# Patient Record
Sex: Male | Born: 1950 | Race: White | Hispanic: No | Marital: Married | Smoking: Former smoker
Health system: Southern US, Community
[De-identification: ages and names within clinical notes are randomized; demographics above are authoritative.]

## PROBLEM LIST (undated history)

## (undated) DIAGNOSIS — E789 Disorder of lipoprotein metabolism, unspecified: Secondary | ICD-10-CM

## (undated) DIAGNOSIS — M0230 Reiter's disease, unspecified site: Secondary | ICD-10-CM

## (undated) DIAGNOSIS — M109 Gout, unspecified: Secondary | ICD-10-CM

## (undated) DIAGNOSIS — M1611 Unilateral primary osteoarthritis, right hip: Secondary | ICD-10-CM

## (undated) HISTORY — PX: TONSILLECTOMY: SUR1361

## (undated) HISTORY — DX: Gout, unspecified: M10.9

## (undated) HISTORY — DX: Disorder of lipoprotein metabolism, unspecified: E78.9

## (undated) HISTORY — DX: Unilateral primary osteoarthritis, right hip: M16.11

## (undated) HISTORY — DX: Reiter's disease, unspecified site: M02.30

---

## 2016-07-10 ENCOUNTER — Encounter: Payer: Self-pay | Admitting: Rheumatology

## 2016-07-11 LAB — HEPATIC FUNCTION PANEL
ALK PHOS: 104 U/L (ref 25–125)
ALT: 20 U/L (ref 10–40)
AST: 29 U/L (ref 14–40)
BILIRUBIN, TOTAL: 0.4 mg/dL

## 2016-07-11 LAB — CBC AND DIFFERENTIAL
HEMATOCRIT: 43 % (ref 41–53)
HEMOGLOBIN: 14.9 g/dL (ref 13.5–17.5)
PLATELETS: 216 10*3/uL (ref 150–399)
WBC: 4.7 10^3/mL

## 2016-07-11 LAB — BASIC METABOLIC PANEL
BUN: 16 mg/dL (ref 4–21)
Creatinine: 1.1 mg/dL (ref 0.6–1.3)
Glucose: 94 mg/dL
Potassium: 4.2 mmol/L (ref 3.4–5.3)
SODIUM: 141 mmol/L (ref 137–147)

## 2016-08-17 ENCOUNTER — Encounter: Payer: Self-pay | Admitting: Rheumatology

## 2016-08-17 DIAGNOSIS — M1611 Unilateral primary osteoarthritis, right hip: Secondary | ICD-10-CM

## 2016-08-17 DIAGNOSIS — M0230 Reiter's disease, unspecified site: Secondary | ICD-10-CM | POA: Insufficient documentation

## 2016-08-17 DIAGNOSIS — M109 Gout, unspecified: Secondary | ICD-10-CM

## 2016-08-17 HISTORY — DX: Reiter's disease, unspecified site: M02.30

## 2016-08-17 HISTORY — DX: Gout, unspecified: M10.9

## 2016-08-17 HISTORY — DX: Unilateral primary osteoarthritis, right hip: M16.11

## 2016-08-17 NOTE — Progress Notes (Addendum)
*IMAGE* Office Visit Note  Patient: Bruce Tran             Date of Birth: 09-Apr-1951           MRN: [id number]             PCP: Pcp Not In System Referring: No ref. 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 He was also on long-term prednisone at 3 mg and Dr. Estanislado Pandy are discussed with him at length on tapering off of the medication gradually.  History of Present Illness: Bruce Tran is a 65 y.o. male  On Visit Date: 03/29/2016 ===> HPI:  Bruce Tran is a 65 year old male with history of inflammatory arthritis and gout.  He states he is doing much better after the right hip joint cortisone injection.  He still has some lower back pain which he describes actually in the lower thoracic area in the paravertebral region which appears to be muscular. He has some lower extremity pain.  He denies any joint swelling.  He states he has been tolerating medications well.  He restarted taking prednisone and still on 3 mg of prednisone.  He denies any gout flare.  He has difficulty walking off and on when his back hurts.    On Visit Date: 03/29/2016 ===>IMPRESSION AND PLAN:  Seronegative inflammatory arthritis.  No synovitis on examination.  He has is on high risk prescription taking methotrexate 0.8 mL subcutaneously and prednisone 3 mg a day for which is controlling his symptoms really well.  I did discourage the use of long term prednisone.  We have tried to taper it several times in the past, but he restarts it.  As discussed again tapering prednisone he is willing to do so again.  He has gout.  He has not had any flares.  It has been very well controlled on allopurinol.  I would check his  uric acid today.  He also has history of vitamin D deficiency.  We will check vitamin D as well.  He has thoracic muscle spasm.  I offered a muscle relaxer, but he declined.  He had last bone density about 5 years ago.  I have advised him to get another bone density which he can discuss with Dr. Vista Deck and can do locally in [place].  A prescription for prednisone was given per his request and also prescription for needles and syringes were given.  Face-to-face time spent with the patient was 30 minutes, 50% time was spent in counseling and coordination of care.  He will be back in 5 months.  Patient is tapered off of the prednisone in the beginning of October and he went from 3 mg all the way down to completing his taper. Patient states that after he stopped the prednisone completely he began having his headaches. The headache is to the occipital area as well as the muscles in the neck and it radiates towards the occiput and the front. He is in so much pain that he ended up relying on his wife's diclofenac. He is taking 100 mg every day for the last few weeks.  Patient has a history of neck injury many years ago. He feels that the neck injury is contributing to all  of his neck pain. He is to keep it well controlled with the low-dose of chronic prednisone. Note the patient did taper off of the prednisone per her typical schedule. He was on 3 mg for a month then on 2 mg 4 months and then on 1 mg for a month and 1 mg every other day for about a week. And then stop.  After stopping the prednisone he started having his headaches which he finds debilitating. Note the patient states that his pain in his neck/headache is rated 11 on a scale of 0-10. He is quite aware of the side effects of the prednisone and he is trying hard for the last 1 month to stay off of the prednisone but he feels that he can. He is requesting to go back on 1 mg of prednisone even though he has plenty of 1 mg of prednisone to take 3 mg every  day, he wants to try 1 mg every day and see if that will give him some relief. I am agreeable and he will call me in a couple of weeks to notify me of how he's doing with the 1 mg.  I offered patient physical therapy for the neck but he's done that before and has given him no relief whatsoever.  We discussed using Motrin and alternating with Tylenol for the next week or 2 to see if he can try not being on prednisone at all and he is eager to do that.  He will only go on prednisone if he has no relief with Motrin alternating with Tylenol.  Patient also suffers from gout but he is well controlled with allopurinol. He has had no gout flare in a long time.     Activities of Daily Living:  Patient reports morning stiffness for 15 minutes.   Patient Reports nocturnal pain.  Difficulty dressing/grooming: Reports Difficulty climbing stairs: Denies Difficulty getting out of chair: Denies Difficulty using hands for taps, buttons, cutlery, and/or writing: Denies   Review of Systems  Constitutional: Negative for fatigue.  HENT: Negative for mouth sores and mouth dryness.   Eyes: Negative for dryness.  Respiratory: Negative for shortness of breath.   Gastrointestinal: Negative for constipation and diarrhea.  Musculoskeletal: Negative for myalgias and myalgias.  Skin: Negative for sensitivity to sunlight.  Neurological: Negative for memory loss.  Psychiatric/Behavioral: Negative for sleep disturbance.    PMFS History:  Patient Active Problem List   Diagnosis Date Noted  . Gout 08/17/2016  . 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 Osteoarthritis of right hip 08/17/2016    Past Medical History:  Diagnosis Date  . Borderline high cholesterol   . Gout 08/17/2016   Crystal Proven  . Osteoarthritis of right hip 08/17/2016  . 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 Emphysema Mother    Past Surgical History:  Procedure Laterality Date  .  TONSILLECTOMY     Social History   Social History Narrative  . No narrative on file   On Visit Date: 03/29/2016 ===> SOCIAL HISTORY:  He is a nonsmoker.  Does not drink any alcohol.  Drinks diet decaffeinated soda and caffeinated coffee.  He does exercise on a regular basis.   On Visit Date: 03/29/2016 ===> CURRENT MEDICATIONS:  Prednisone 3 mg a day, allopurinol 300 mg a day, methotrexate 0.8 mL subcutaneously and folic acid 2 mg a day.    On Visit Date: 03/29/2016 ===> MEDICATION ALLERGIES:  NO KNOWN  DRUG ALLERGIES.   Objective: Vital Signs: BP (!) 162/103 (BP Location: Left Arm, Patient Position: Sitting, Cuff Size: Large)   Pulse 78   Resp 14   Ht 5' 10.5" (1.791 m)   Wt 218 lb (98.9 kg)   BMI 30.84 kg/m    Physical Exam  Constitutional: He is oriented to person, place, and time. He appears well-developed and well-nourished.  HENT:  Head: Normocephalic and atraumatic.  Eyes: Conjunctivae and EOM are normal. Pupils are equal, round, and reactive to light.  Neck: Normal range of motion. Neck supple.  Cardiovascular: Normal rate, regular rhythm and normal heart sounds.  Exam reveals no gallop and no friction rub.   No murmur heard. Pulmonary/Chest: Effort normal and breath sounds normal. No respiratory distress. He has no wheezes. He has no rales. He exhibits no tenderness.  Abdominal: Soft. He exhibits no distension and no mass. There is no tenderness. There is no guarding.  Musculoskeletal: Normal range of motion.  Lymphadenopathy:    He has no cervical adenopathy.  Neurological: He is alert and oriented to person, place, and time. He exhibits normal muscle tone. Coordination normal.  Skin: Skin is warm and dry. Capillary refill takes less than 2 seconds. No rash noted.  Psychiatric: He has a normal mood and affect. His behavior is normal. Judgment and thought content normal.  Nursing note and vitals reviewed.    Musculoskeletal Exam:   On Visit Date: 03/29/2016 ===>  Musculoskeletal:  He has good range of motion of his C-spine, thoracic and lumbar spine, shoulders, elbows, wrist joints, MCP, PIP, DIP.  He has thickening of PIP, DIP joints in his hands and also his right elbow joint contracture which has not changed.  He has good range of motion of his hip joints, knee joints, ankles, MTP, PIP.  He had a lot of discomfort in the lower thoracic region in the muscular part paravertebral region.   Today's examination: Full range of motion of all joints Grip strength is equal and strong bilaterally Fibromyalgia tender points are all absent  CDAI Exam: CDAI Homunculus Exam:   Joint Counts:  CDAI Tender Joint count: 0 CDAI Swollen Joint count: 0     Investigation:  On Visit Date: 03/29/2016 ===> INVESTIGATIONS:  March 2017:  CBC and comprehensive metabolic panel was normal.  February 2016 uric acid was 6.3.  On Aug 21, 2016 ===> pt returns this morning for AM Cortisol levels.  He will return Jan 2018 for his standing order.  He wants to get labs drawn from our office next time also.  No additional findings.   Imaging: No results found.  Speciality Comments: No specialty comments available.    Procedures:  No procedures performed Allergies: Patient has no known allergies.   Assessment / Plan: Visit Diagnoses: Inflammatory arthritis  Chronic gout without tophus, unspecified cause, unspecified site - Plan: Uric acid, Uric acid  Neck pain, chronic  High risk medications (not anticoagulants) long-term use - Plan: CBC with Differential/Platelet, COMPLETE METABOLIC PANEL WITH GFR, Uric acid, Cortisol-am, blood, CBC with Differential/Platelet, COMPLETE METABOLIC PANEL WITH GFR, Uric acid, Cortisol-am, blood  Encounter for long-term (current) use of high-risk medication  High risk medication use   For patient's neck pain, we discussed Voltaren gel. He is agreeable. I will prescribe Voltaren gel 3 g of 3 large joints up to 3 times a day when  necessary dispensed 3 tubes with refills  We will also use Robaxin 500 mg 1 by mouth 3 times a day  when necessary dispense 90 pills with 5 refills  He will use Motrin 600 mg on one day and alternate with Tylenol 2 pills up to 3 times a day when necessary  He will also use only supplement list if he can before he uses the over-the-counter and states or Tylenol.  If none of this works, then patient has the option of starting his prednisone and he has enough at home. He will start with the lower dose to 1 mg.  Refill allopurinol  Due to the long-term use of prednisone and relatively short taper over the last 3 months, Dr. Estanislado Pandy wants to be sure that he is not having any adrenal insufficiency. As a result will be trying a.m. cortisol level. Patient will return to clinic tomorrow to get the blood drawn. He lives in [place] but he finds this the best option for him. He's had a lot of hassles at his PCPs office of getting the proper blood drawn.  His last blood work done on 07/10/2016 with CBC with differential and CMP with GFR normal.  We will do uric acid test on the next labs and then 6 months later. He is doing well with his uric acid and gout overall and he does not need any more frequent levels than that.  The neck pain causing his headaches is an issue that I am concerned about. We may want to do an MRI if this problem continues. We will wait and see how well he does with the muscle relaxer that have given him.  Orders: Orders Placed This Encounter  Procedures  . CBC with Differential/Platelet  . COMPLETE METABOLIC PANEL WITH GFR  . Uric acid  . Cortisol-am, blood   Meds ordered this encounter  Medications  . omeprazole (PRILOSEC) 20 MG capsule  . B-D TB SYRINGE 1CC/27GX1/2" 27G X 1/2" 1 ML MISC  . diclofenac sodium (VOLTAREN) 1 % GEL    Sig: Voltaren Gel 3 grams to 3 large joints upto TID 3 TUBES with 3 refills    Dispense:  3 Tube    Refill:  3    Voltaren Gel 3  grams to 3 large joints upto TID 3 TUBES with 3 refills    Order Specific Question:   Supervising Provider    Answer:   Bo Merino [[id number]]  . methocarbamol (ROBAXIN) 500 MG tablet    Sig: Take 1 tablet (500 mg total) by mouth 3 (three) times daily.    Dispense:  90 tablet    Refill:  1    Order Specific Question:   Supervising Provider    Answer:   Bo Merino [[id number]]  . Methotrexate, PF, 20 MG/0.4ML SOAJ    Sig: 0.8 mL subcutaneous every week; disp 31ml with preservatives.    Dispense:  10 mL    Refill:  0    Order Specific Question:   Supervising Provider    Answer:   Bo Merino [id number]    Face-to-face time spent with patient was 40 minutes. 50% of time was spent in counseling and coordination of care.  Follow-Up Instructions: Return in about 5 months (around 01/18/2017) for Inflam Arthritis (sero neg), Mtx, Gout, Allopurinol, neck pain, neck muscle spasm.  I examined and evaluated the patient with Eliezer Lofts PA. The plan of care was discussed as noted above.  Bo Merino, MD

## 2016-08-20 ENCOUNTER — Encounter: Payer: Self-pay | Admitting: Rheumatology

## 2016-08-20 ENCOUNTER — Ambulatory Visit (INDEPENDENT_AMBULATORY_CARE_PROVIDER_SITE_OTHER): Payer: Medicare Other | Admitting: Rheumatology

## 2016-08-20 VITALS — BP 162/103 | HR 78 | Resp 14 | Ht 70.5 in | Wt 218.0 lb

## 2016-08-20 DIAGNOSIS — M1A9XX0 Chronic gout, unspecified, without tophus (tophi): Secondary | ICD-10-CM | POA: Diagnosis not present

## 2016-08-20 DIAGNOSIS — M542 Cervicalgia: Secondary | ICD-10-CM

## 2016-08-20 DIAGNOSIS — M1990 Unspecified osteoarthritis, unspecified site: Secondary | ICD-10-CM

## 2016-08-20 DIAGNOSIS — Z79899 Other long term (current) drug therapy: Secondary | ICD-10-CM | POA: Diagnosis not present

## 2016-08-20 DIAGNOSIS — G8929 Other chronic pain: Secondary | ICD-10-CM

## 2016-08-20 MED ORDER — METHOTREXATE (PF) 20 MG/0.4ML ~~LOC~~ SOAJ: SUBCUTANEOUS | 0 refills | Status: DC

## 2016-08-20 MED ORDER — DICLOFENAC SODIUM 1 % TD GEL: TRANSDERMAL | 3 refills | Status: DC

## 2016-08-20 MED ORDER — METHOCARBAMOL 500 MG PO TABS: 500.0000 mg | ORAL_TABLET | Freq: Three times a day (TID) | ORAL | 1 refills | Status: AC

## 2016-08-20 NOTE — Patient Instructions (Signed)
Supplements for OA Natural anti-inflammatories  You can purchase these at [employer], [employer] or online.  . Turmeric (capsules)  . Ginger (ginger root or capsules)  . Omega 3 (Fish, flax seeds, chia seeds, walnuts, almonds)  . Tart cherry (dried or extract)   Patient should be under the care of a physician while taking these supplements. This may not be reproduced without the permission of Dr. Shaili Deveshwar.  

## 2016-08-21 NOTE — Addendum Note (Signed)
Addended byEliezer Lofts on: 08/21/2016 08:32 AM   Modules accepted: Orders

## 2016-08-22 LAB — CORTISOL-AM, BLOOD: Cortisol - AM: 15.2 ug/dL

## 2016-08-22 NOTE — Progress Notes (Signed)
Informed patient that his morning cortisol level is normal. No further treatment is needed at this time regarding his cortisol level.We only did this because he was taking prednisone for many years and we had to stop it over time and wanted to ensure that there was no adrenocortical suppression.

## 2016-08-23 ENCOUNTER — Other Ambulatory Visit: Payer: Self-pay | Admitting: Radiology

## 2016-08-23 MED ORDER — METHOTREXATE SODIUM CHEMO INJECTION 50 MG/2ML: 20.0000 mg | Freq: Once | INTRAMUSCULAR | 0 refills | Status: DC

## 2016-08-23 NOTE — Telephone Encounter (Signed)
Methotrexate pen / Otrexup was sent yesterday, I think you were trying to send in the Injectable MTX< I have pended Rx if this is correct, please submit

## 2016-08-23 NOTE — Telephone Encounter (Signed)
Okay to prescribe methotrexate 50 mg per 2 ML Inject 0.8 ML's every week Dispense 10 ML's

## 2016-10-30 ENCOUNTER — Other Ambulatory Visit: Payer: Self-pay | Admitting: *Deleted

## 2016-10-30 DIAGNOSIS — Z79899 Other long term (current) drug therapy: Secondary | ICD-10-CM

## 2016-10-30 LAB — CBC WITH DIFFERENTIAL/PLATELET
BASOS ABS: 58 {cells}/uL (ref 0–200)
Basophils Relative: 1 %
EOS PCT: 5 %
Eosinophils Absolute: 290 cells/uL (ref 15–500)
HCT: 44.6 % (ref 38.5–50.0)
HEMOGLOBIN: 15.1 g/dL (ref 13.2–17.1)
LYMPHS ABS: 1740 {cells}/uL (ref 850–3900)
Lymphocytes Relative: 30 %
MCH: 32.2 pg (ref 27.0–33.0)
MCHC: 33.9 g/dL (ref 32.0–36.0)
MCV: 95.1 fL (ref 80.0–100.0)
MPV: 10.5 fL (ref 7.5–12.5)
Monocytes Absolute: 580 cells/uL (ref 200–950)
Monocytes Relative: 10 %
NEUTROS ABS: 3132 {cells}/uL (ref 1500–7800)
Neutrophils Relative %: 54 %
Platelets: 235 10*3/uL (ref 140–400)
RBC: 4.69 MIL/uL (ref 4.20–5.80)
RDW: 13.4 % (ref 11.0–15.0)
WBC: 5.8 10*3/uL (ref 3.8–10.8)

## 2016-10-30 LAB — COMPLETE METABOLIC PANEL WITH GFR
ALBUMIN: 3.9 g/dL (ref 3.6–5.1)
ALK PHOS: 91 U/L (ref 40–115)
ALT: 17 U/L (ref 9–46)
AST: 27 U/L (ref 10–35)
BILIRUBIN TOTAL: 0.7 mg/dL (ref 0.2–1.2)
BUN: 15 mg/dL (ref 7–25)
CO2: 27 mmol/L (ref 20–31)
Calcium: 9.3 mg/dL (ref 8.6–10.3)
Chloride: 103 mmol/L (ref 98–110)
Creat: 1.02 mg/dL (ref 0.70–1.25)
GFR, Est African American: 88 mL/min (ref >=60)
GFR, Est Non African American: 76 mL/min (ref >=60)
GLUCOSE: 95 mg/dL (ref 65–99)
Potassium: 4.9 mmol/L (ref 3.5–5.3)
SODIUM: 138 mmol/L (ref 135–146)
TOTAL PROTEIN: 6.5 g/dL (ref 6.1–8.1)

## 2016-11-09 ENCOUNTER — Other Ambulatory Visit: Payer: Self-pay | Admitting: Rheumatology

## 2016-11-09 NOTE — Telephone Encounter (Signed)
Last Visit: 08/20/16 Next Visit: 01/18/17 Labs: 10/30/16 WNL  Okay to refill Allopurinol and Folic Acid?

## 2017-01-02 DIAGNOSIS — M542 Cervicalgia: Secondary | ICD-10-CM | POA: Insufficient documentation

## 2017-01-02 DIAGNOSIS — M1990 Unspecified osteoarthritis, unspecified site: Secondary | ICD-10-CM | POA: Insufficient documentation

## 2017-01-02 DIAGNOSIS — Z79899 Other long term (current) drug therapy: Secondary | ICD-10-CM | POA: Insufficient documentation
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
## 2017-01-08 ENCOUNTER — Ambulatory Visit (INDEPENDENT_AMBULATORY_CARE_PROVIDER_SITE_OTHER): Payer: Medicare Other | Admitting: Rheumatology

## 2017-01-08 ENCOUNTER — Encounter: Payer: Self-pay | Admitting: Rheumatology

## 2017-01-08 VITALS — BP 140/80 | HR 78 | Resp 14 | Ht 70.5 in | Wt 194.0 lb

## 2017-01-08 DIAGNOSIS — M1A09X0 Idiopathic chronic gout, multiple sites, without tophus (tophi): Secondary | ICD-10-CM | POA: Diagnosis not present

## 2017-01-08 DIAGNOSIS — M65331 Trigger finger, right middle finger: Secondary | ICD-10-CM | POA: Diagnosis not present

## 2017-01-08 DIAGNOSIS — Z79899 Other long term (current) drug therapy: Secondary | ICD-10-CM | POA: Diagnosis not present

## 2017-01-08 DIAGNOSIS — M542 Cervicalgia: Secondary | ICD-10-CM

## 2017-01-08 DIAGNOSIS — M1990 Unspecified osteoarthritis, unspecified site: Secondary | ICD-10-CM

## 2017-01-08 DIAGNOSIS — G5601 Carpal tunnel syndrome, right upper limb: Secondary | ICD-10-CM

## 2017-01-08 LAB — CBC WITH DIFFERENTIAL/PLATELET
BASOS ABS: 59 {cells}/uL (ref 0–200)
Basophils Relative: 1 %
EOS ABS: 177 {cells}/uL (ref 15–500)
Eosinophils Relative: 3 %
HEMATOCRIT: 44.4 % (ref 38.5–50.0)
Hemoglobin: 15.1 g/dL (ref 13.2–17.1)
LYMPHS PCT: 27 %
Lymphs Abs: 1593 cells/uL (ref 850–3900)
MCH: 32.2 pg (ref 27.0–33.0)
MCHC: 34 g/dL (ref 32.0–36.0)
MCV: 94.7 fL (ref 80.0–100.0)
MONO ABS: 472 {cells}/uL (ref 200–950)
MPV: 10.3 fL (ref 7.5–12.5)
Monocytes Relative: 8 %
NEUTROS PCT: 61 %
Neutro Abs: 3599 cells/uL (ref 1500–7800)
Platelets: 231 10*3/uL (ref 140–400)
RBC: 4.69 MIL/uL (ref 4.20–5.80)
RDW: 15.8 % — AB (ref 11.0–15.0)
WBC: 5.9 10*3/uL (ref 3.8–10.8)

## 2017-01-08 LAB — COMPLETE METABOLIC PANEL WITH GFR
ALT: 18 U/L (ref 9–46)
AST: 25 U/L (ref 10–35)
Albumin: 4.1 g/dL (ref 3.6–5.1)
Alkaline Phosphatase: 92 U/L (ref 40–115)
BUN: 15 mg/dL (ref 7–25)
CO2: 22 mmol/L (ref 20–31)
Calcium: 9.5 mg/dL (ref 8.6–10.3)
Chloride: 105 mmol/L (ref 98–110)
Creat: 1.02 mg/dL (ref 0.70–1.25)
GFR, EST NON AFRICAN AMERICAN: 76 mL/min (ref >=60)
GFR, Est African American: 88 mL/min (ref >=60)
GLUCOSE: 89 mg/dL (ref 65–99)
POTASSIUM: 4.7 mmol/L (ref 3.5–5.3)
Sodium: 140 mmol/L (ref 135–146)
Total Bilirubin: 0.5 mg/dL (ref 0.2–1.2)
Total Protein: 6.8 g/dL (ref 6.1–8.1)

## 2017-01-08 MED ORDER — PREDNISONE 5 MG PO TABS: ORAL_TABLET | ORAL | 0 refills | Status: DC

## 2017-01-08 MED ORDER — "BD TB SYRINGE 27G X 1/2"" 1 ML MISC": 1.0000 | 4 refills | Status: DC

## 2017-01-09 LAB — URIC ACID: Uric Acid, Serum: 4.8 mg/dL (ref 4.0–8.0)

## 2017-01-18 ENCOUNTER — Ambulatory Visit: Payer: Medicare Other | Admitting: Rheumatology

## 2017-02-19 ENCOUNTER — Other Ambulatory Visit: Payer: Self-pay | Admitting: Rheumatology

## 2017-02-19 NOTE — Telephone Encounter (Signed)
Last Visit: 01/08/17 Next Visit: 06/11/17  Okay to refill Omprazole?

## 2017-02-19 NOTE — Telephone Encounter (Signed)
ok 

## 2017-04-29 ENCOUNTER — Other Ambulatory Visit: Payer: Self-pay

## 2017-04-29 DIAGNOSIS — Z79899 Other long term (current) drug therapy: Secondary | ICD-10-CM

## 2017-04-29 LAB — CBC WITH DIFFERENTIAL/PLATELET
BASOS ABS: 59 {cells}/uL (ref 0–200)
Basophils Relative: 1 %
Eosinophils Absolute: 177 cells/uL (ref 15–500)
Eosinophils Relative: 3 %
HCT: 45.1 % (ref 38.5–50.0)
HEMOGLOBIN: 15.1 g/dL (ref 13.2–17.1)
LYMPHS PCT: 32 %
Lymphs Abs: 1888 cells/uL (ref 850–3900)
MCH: 33.3 pg — AB (ref 27.0–33.0)
MCHC: 33.5 g/dL (ref 32.0–36.0)
MCV: 99.3 fL (ref 80.0–100.0)
MPV: 10.6 fL (ref 7.5–12.5)
Monocytes Absolute: 413 cells/uL (ref 200–950)
Monocytes Relative: 7 %
NEUTROS PCT: 57 %
Neutro Abs: 3363 cells/uL (ref 1500–7800)
Platelets: 219 10*3/uL (ref 140–400)
RBC: 4.54 MIL/uL (ref 4.20–5.80)
RDW: 13.8 % (ref 11.0–15.0)
WBC: 5.9 10*3/uL (ref 3.8–10.8)

## 2017-04-30 LAB — COMPLETE METABOLIC PANEL WITHOUT GFR
ALT: 13 U/L (ref 9–46)
AST: 20 U/L (ref 10–35)
Albumin: 4 g/dL (ref 3.6–5.1)
Alkaline Phosphatase: 93 U/L (ref 40–115)
BUN: 15 mg/dL (ref 7–25)
CO2: 24 mmol/L (ref 20–31)
Calcium: 8.9 mg/dL (ref 8.6–10.3)
Chloride: 104 mmol/L (ref 98–110)
Creat: 1 mg/dL (ref 0.70–1.25)
GFR, Est African American: >89 mL/min
GFR, Est Non African American: 78 mL/min
Glucose, Bld: 88 mg/dL (ref 65–99)
Potassium: 4.8 mmol/L (ref 3.5–5.3)
Sodium: 138 mmol/L (ref 135–146)
Total Bilirubin: 0.5 mg/dL (ref 0.2–1.2)
Total Protein: 6.5 g/dL (ref 6.1–8.1)

## 2017-05-15 ENCOUNTER — Other Ambulatory Visit: Payer: Self-pay | Admitting: Rheumatology

## 2017-05-15 NOTE — Telephone Encounter (Signed)
Last Visit: 01/08/17 Next Visit: 06/11/17 Labs: 04/29/17 WNL  Okay to refill per Dr. Estanislado Pandy

## 2017-05-20 ENCOUNTER — Other Ambulatory Visit: Payer: Self-pay | Admitting: Rheumatology

## 2017-05-20 NOTE — Telephone Encounter (Signed)
Patient calling in reference to MTX refill. Seems to be a discrepancy with rx . (ML) issues [facility] needs clarification. Please call patient to discuss. [phone]

## 2017-05-20 NOTE — Telephone Encounter (Signed)
Called [facility] spoke to [place]  and clarified prescription directions.

## 2017-05-22 ENCOUNTER — Telehealth: Payer: Self-pay | Admitting: Rheumatology

## 2017-05-22 ENCOUNTER — Telehealth: Payer: Self-pay | Admitting: Radiology

## 2017-05-22 MED ORDER — METHOTREXATE SODIUM CHEMO INJECTION 50 MG/2ML: 20.0000 mg | INTRAMUSCULAR | 0 refills | Status: DC

## 2017-05-22 NOTE — Telephone Encounter (Signed)
Refill request received via fax for Methotrexate refill from [facility]

## 2017-05-22 NOTE — Telephone Encounter (Signed)
Resent with corrected sig.

## 2017-05-22 NOTE — Telephone Encounter (Signed)
[facility] calling in ference to MTX injections. They need frequency of injections. Please call using ref# [id number]

## 2017-05-22 NOTE — Telephone Encounter (Signed)
This was a clarification request sent in for clarification

## 2017-05-27 ENCOUNTER — Other Ambulatory Visit: Payer: Self-pay | Admitting: Rheumatology

## 2017-05-27 NOTE — Telephone Encounter (Signed)
01/08/17 06/11/17  CMP Latest Ref Rng & Units 04/29/2017 01/08/2017 10/30/2016  Glucose 65 - 99 mg/dL 88 89 95  BUN 7 - 25 mg/dL 15 15 15   Creatinine 0.70 - 1.25 mg/dL 1.00 1.02 1.02  Sodium 135 - 146 mmol/L 138 140 138  Potassium 3.5 - 5.3 mmol/L 4.8 4.7 4.9  Chloride 98 - 110 mmol/L 104 105 103  CO2 20 - 31 mmol/L 24 22 27   Calcium 8.6 - 10.3 mg/dL 8.9 9.5 9.3  Total Protein 6.1 - 8.1 g/dL 6.5 6.8 6.5  Total Bilirubin 0.2 - 1.2 mg/dL 0.5 0.5 0.7  Alkaline Phos 40 - 115 U/L 93 92 91  AST 10 - 35 U/L 20 25 27   ALT 9 - 46 U/L 13 18 17     Ok to refill per Dr Estanislado Pandy

## 2017-06-03 DIAGNOSIS — Z7952 Long term (current) use of systemic steroids: Secondary | ICD-10-CM | POA: Insufficient documentation

## 2017-06-03 NOTE — Progress Notes (Signed)
Office Visit Note  Patient: Bruce Tran             Date of Birth: June 07, 1951           MRN: [id number]             PCP: System, Pcp Not In Referring: No ref. provider found Visit Date: 06/04/2017 Occupation: @GUAROCC @    Subjective:  Muscle and tendon pain.   History of Present Illness: Bruce Tran is a 66 y.o. male with history of inflammatory arthritis. He's been on methotrexate for control of his inflammatory arthritis. He states" he doesn't have any joint pain or joint swelling but he gets pain and inflammation in his muscles and tendons". He states he developed a rash on his back. He took some prednisone after the last visit and he felt that the symptoms resolved after taking the prednisone course. He states he had some leftover prednisone and he had recurrence of symptoms and took more prednisone for that. He states without prednisone he feels very stiff and discomfort has discomfort in his muscles and tendons of his lower extremities. He continues to have some lower back pain. Although he denies any radiculopathy to his lower extremities. His right hip is doing better after the cortisone injection.  Activities of Daily Living:  Patient reports morning stiffness for 0 minute.   Patient Reports nocturnal pain. Lower extremities  Difficulty dressing/grooming: Denies Difficulty climbing stairs: Reports Difficulty getting out of chair: Reports Difficulty using hands for taps, buttons, cutlery, and/or writing: Denies   Review of Systems  Constitutional: Positive for fatigue. Negative for night sweats and weakness ( ).  HENT: Negative for mouth sores, mouth dryness and nose dryness.   Eyes: Negative for redness and dryness.  Respiratory: Negative for shortness of breath and difficulty breathing.   Cardiovascular: Negative for chest pain, palpitations, hypertension, irregular heartbeat and swelling in legs/feet.  Gastrointestinal: Negative for constipation and diarrhea.    Endocrine: Negative for increased urination.  Musculoskeletal: Positive for arthralgias, joint pain, joint swelling, myalgias and myalgias. Negative for muscle weakness, morning stiffness and muscle tenderness.  Skin: Positive for rash. Negative for color change, hair loss, nodules/bumps, skin tightness, ulcers and sensitivity to sunlight.  Allergic/Immunologic: Negative for susceptible to infections.  Neurological: Negative for dizziness, fainting, memory loss and night sweats.  Hematological: Negative for swollen glands.  Psychiatric/Behavioral: Negative for depressed mood and sleep disturbance. The patient is not nervous/anxious.     PMFS History:  Patient Active Problem List   Diagnosis Date Noted  . On prednisone therapy 06/03/2017  . Inflammatory arthritis 01/02/2017  . Neck pain 01/02/2017  . Encounter for long-term current use of high risk medication 01/02/2017  . High risk medication use 01/02/2017  . Gout 08/17/2016  . Reiter's disease ([place]) 08/17/2016  . Osteoarthritis of right hip 08/17/2016    Past Medical History:  Diagnosis Date  . Borderline high cholesterol   . Gout 08/17/2016   Crystal Proven  . Osteoarthritis of right hip 08/17/2016  . Reiter's disease ([place]) 08/17/2016    Family History  Problem Relation Age of Onset  . Emphysema Mother   . Emphysema Sister    Past Surgical History:  Procedure Laterality Date  . TONSILLECTOMY     Social History   Social History Narrative  . No narrative on file     Objective: Vital Signs: BP (!) 147/84 (BP Location: Left Arm, Patient Position: Sitting, Cuff Size: Normal)   Pulse 66   Ht 5'  10" (1.778 m)   Wt 200 lb (90.7 kg)   BMI 28.70 kg/m    Physical Exam  Constitutional: He is oriented to person, place, and time. He appears well-developed and well-nourished.  HENT:  Head: Normocephalic and atraumatic.  Eyes: Pupils are equal, round, and reactive to light. Conjunctivae and EOM are normal.  Neck: Normal  range of motion. Neck supple.  Cardiovascular: Normal rate, regular rhythm and normal heart sounds.   Pulmonary/Chest: Effort normal and breath sounds normal.  Abdominal: Soft. Bowel sounds are normal.  Neurological: He is alert and oriented to person, place, and time.  Skin: Skin is warm and dry. Capillary refill takes less than 2 seconds.  Psychiatric: He has a normal mood and affect. His behavior is normal.  Nursing note and vitals reviewed.    Musculoskeletal Exam: C-spine and thoracic lumbar spine good range of motion. No SI joint tenderness. Shoulder joints are good range of motion. He has limited extension of his bilateral elbows. He had no synovitis over his wrist joints or MCP joints. He has PIP/DIP thickening in his hands consistent with osteoarthritis. He has limited range of motion of his right hip joint with some discomfort. Knee joints ankles MTPs PIPs DIPs with good range of motion with no synovitis.  CDAI Exam: No CDAI exam completed.    Investigation: No additional findings. CBC Latest Ref Rng & Units 04/29/2017 01/08/2017 10/30/2016  WBC 3.8 - 10.8 K/uL 5.9 5.9 5.8  Hemoglobin 13.2 - 17.1 g/dL 15.1 15.1 15.1  Hematocrit 38.5 - 50.0 % 45.1 44.4 44.6  Platelets 140 - 400 K/uL 219 231 235   CMP     Component Value Date/Time   NA 138 04/29/2017 1031   NA 141 07/11/2016   K 4.8 04/29/2017 1031   CL 104 04/29/2017 1031   CO2 24 04/29/2017 1031   GLUCOSE 88 04/29/2017 1031   BUN 15 04/29/2017 1031   BUN 16 07/11/2016   CREATININE 1.00 04/29/2017 1031   CALCIUM 8.9 04/29/2017 1031   PROT 6.5 04/29/2017 1031   ALBUMIN 4.0 04/29/2017 1031   AST 20 04/29/2017 1031   ALT 13 04/29/2017 1031   ALKPHOS 93 04/29/2017 1031   BILITOT 0.5 04/29/2017 1031   GFRNONAA 78 04/29/2017 1031   GFRAA >89 04/29/2017 1031    Imaging: No results found.  Speciality Comments: No specialty comments available.    Procedures:  No procedures performed Allergies: Patient has no known  allergies.   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 Which is generally well on methotrexate. He has no synovitis on examination. He was on prednisone for multiple years and it took me a while to take him off the prednisone. He was given in the prednisone taper last visit by Mr. Carlyon Shadow. Patient states that he did much better after the prednisone taper as his muscles and tendons were aching and they all got better. He also reports that he had a rash at that time which resolved. He gives history of ongoing muscle and tendon pain and recurrent rash. I do not see any of that on examination today. We had detailed discussion regarding that I would like to witness the rash and inflammation at the some point. He's been advised to come in for an appointment when he has a flare. I would also obtain following labs with his next visit.  High risk medication use - Methotrexate 0.8 ML subcutaneous  every week, folic acid 2 mg by mouth daily - Plan: CBC with Differential/Platelet, COMPLETE METABOLIC PANEL WITH GFR, Serum protein electrophoresis with reflex. His standing orders will be checked every 3 months as he is on long-term methotrexate.  Idiopathic chronic gout of multiple sites without tophus - On allopurinol 300 mg by mouth daily. His uric acid wasn't desirable range he will continue allopurinol for right now.  Myalgia - Plan: Sedimentation rate, CK, TSH, VITAMIN D 25 Hydroxy (Vit-D Deficiency, Fractures). He has no muscle weakness on examination. We had detailed discussion regarding his myalgias. I discussed the option of Cymbalta which she declined. I also discussed that he may consider a neurological evaluation for lower back pain, lower extremity muscle pain.  On prednisone therapy - Prednisone taper April 2018. Patient is taking long-term prednisone. He was given a prednisone taper in April. He also took another  prednisone taper himself at home. I discouraged the use of prednisone. Long-term side effects of prednisone were discussed at length. He is somewhat dissatisfied that I am not prescribing prednisone.  Primary osteoarthritis of right hip: Doing better after cortisone injection.  Screening for osteoporosis - Last DEXA normal 12/08/2010 [facility] in [place]     Orders: Orders Placed This Encounter  Procedures  . CBC with Differential/Platelet  . COMPLETE METABOLIC PANEL WITH GFR  . Sedimentation rate  . CK  . TSH  . VITAMIN D 25 Hydroxy (Vit-D Deficiency, Fractures)  . Serum protein electrophoresis with reflex   No orders of the defined types were placed in this encounter.   Face-to-face time spent with patient was 30 minutes.Greater than 50% of time was spent in counseling and coordination of care.  Follow-Up Instructions: Return in about 4 months (around 10/04/2017) for inflammatory arthritis.   Bo Merino, MD  Note - This record has been created using Editor, commissioning.  Chart creation errors have been sought, but may not always  have been located. Such creation errors do not reflect on  the standard of medical care.

## 2017-06-04 ENCOUNTER — Ambulatory Visit (INDEPENDENT_AMBULATORY_CARE_PROVIDER_SITE_OTHER): Payer: Medicare Other | Admitting: Rheumatology

## 2017-06-04 ENCOUNTER — Encounter: Payer: Self-pay | Admitting: Rheumatology

## 2017-06-04 VITALS — BP 147/84 | HR 66 | Ht 70.0 in | Wt 200.0 lb

## 2017-06-04 DIAGNOSIS — Z79899 Other long term (current) drug therapy: Secondary | ICD-10-CM | POA: Diagnosis not present

## 2017-06-04 DIAGNOSIS — M1611 Unilateral primary osteoarthritis, right hip: Secondary | ICD-10-CM

## 2017-06-04 DIAGNOSIS — M791 Myalgia, unspecified site: Secondary | ICD-10-CM

## 2017-06-04 DIAGNOSIS — M1A09X0 Idiopathic chronic gout, multiple sites, without tophus (tophi): Secondary | ICD-10-CM | POA: Diagnosis not present

## 2017-06-04 DIAGNOSIS — Z7952 Long term (current) use of systemic steroids: Secondary | ICD-10-CM | POA: Diagnosis not present

## 2017-06-04 DIAGNOSIS — M0239 Reiter's disease, multiple sites: Secondary | ICD-10-CM

## 2017-06-04 DIAGNOSIS — Z13820 Encounter for screening for osteoporosis: Secondary | ICD-10-CM | POA: Diagnosis not present

## 2017-06-04 NOTE — Patient Instructions (Signed)
Standing Labs We placed an order today for your standing lab work.    Please come back and get your standing labs in October and every 3 months. Other labs are due in October.  We have open lab Monday through Friday from 8:30-11:30 AM and 1:30-4 PM at the office of Dr. Bo Merino.   The office is located at [address], [place], [place], [place] [address] No appointment is necessary.   Labs are drawn by [facility].  You may receive a bill from [facility] for your lab work. If you have any questions regarding directions or hours of operation,  please call [phone].

## 2017-06-11 ENCOUNTER — Ambulatory Visit: Payer: Medicare Other | Admitting: Rheumatology

## 2017-07-23 ENCOUNTER — Other Ambulatory Visit: Payer: Self-pay

## 2017-07-23 DIAGNOSIS — M1A09X0 Idiopathic chronic gout, multiple sites, without tophus (tophi): Secondary | ICD-10-CM

## 2017-07-23 DIAGNOSIS — M791 Myalgia, unspecified site: Secondary | ICD-10-CM

## 2017-07-23 DIAGNOSIS — Z79899 Other long term (current) drug therapy: Secondary | ICD-10-CM

## 2017-07-29 LAB — COMPLETE METABOLIC PANEL WITH GFR
AG RATIO: 1.8 (calc) (ref 1.0–2.5)
ALBUMIN MSPROF: 4.2 g/dL (ref 3.6–5.1)
ALT: 18 U/L (ref 9–46)
AST: 21 U/L (ref 10–35)
Alkaline phosphatase (APISO): 97 U/L (ref 40–115)
BUN: 14 mg/dL (ref 7–25)
CO2: 30 mmol/L (ref 20–32)
Calcium: 9.5 mg/dL (ref 8.6–10.3)
Chloride: 103 mmol/L (ref 98–110)
Creat: 0.96 mg/dL (ref 0.70–1.25)
GFR, EST AFRICAN AMERICAN: 94 mL/min/{1.73_m2} (ref >=60)
GFR, EST NON AFRICAN AMERICAN: 81 mL/min/{1.73_m2} (ref >=60)
GLOBULIN: 2.4 g/dL (ref 1.9–3.7)
Glucose, Bld: 77 mg/dL (ref 65–99)
Potassium: 4.8 mmol/L (ref 3.5–5.3)
Sodium: 139 mmol/L (ref 135–146)
TOTAL PROTEIN: 6.6 g/dL (ref 6.1–8.1)
Total Bilirubin: 0.5 mg/dL (ref 0.2–1.2)

## 2017-07-29 LAB — PROTEIN ELECTROPHORESIS, SERUM, WITH REFLEX
ALPHA 1: 0.3 g/dL (ref 0.2–0.3)
ALPHA 2: 0.6 g/dL (ref 0.5–0.9)
Albumin ELP: 4.1 g/dL (ref 3.8–4.8)
BETA 2: 0.4 g/dL (ref 0.2–0.5)
BETA GLOBULIN: 0.4 g/dL (ref 0.4–0.6)
GAMMA GLOBULIN: 0.9 g/dL (ref 0.8–1.7)
TOTAL PROTEIN: 6.7 g/dL (ref 6.1–8.1)

## 2017-07-29 LAB — VITAMIN D 25 HYDROXY (VIT D DEFICIENCY, FRACTURES): VIT D 25 HYDROXY: 44 ng/mL (ref 30–100)

## 2017-07-29 LAB — CBC WITH DIFFERENTIAL/PLATELET
BASOS ABS: 52 {cells}/uL (ref 0–200)
Basophils Relative: 1.1 %
Eosinophils Absolute: 197 cells/uL (ref 15–500)
Eosinophils Relative: 4.2 %
HEMATOCRIT: 41.4 % (ref 38.5–50.0)
Hemoglobin: 14.3 g/dL (ref 13.2–17.1)
LYMPHS ABS: 1476 {cells}/uL (ref 850–3900)
MCH: 32.4 pg (ref 27.0–33.0)
MCHC: 34.5 g/dL (ref 32.0–36.0)
MCV: 93.9 fL (ref 80.0–100.0)
MPV: 10.5 fL (ref 7.5–12.5)
Monocytes Relative: 7 %
NEUTROS PCT: 56.3 %
Neutro Abs: 2646 cells/uL (ref 1500–7800)
PLATELETS: 250 10*3/uL (ref 140–400)
RBC: 4.41 10*6/uL (ref 4.20–5.80)
RDW: 13.3 % (ref 11.0–15.0)
TOTAL LYMPHOCYTE: 31.4 %
WBC: 4.7 10*3/uL (ref 3.8–10.8)
WBCMIX: 329 {cells}/uL (ref 200–950)

## 2017-07-29 LAB — TSH: TSH: 4.17 m[IU]/L (ref 0.40–4.50)

## 2017-07-29 LAB — IFE INTERPRETATION

## 2017-07-29 LAB — CK: CK TOTAL: 135 U/L (ref 44–196)

## 2017-07-29 LAB — SEDIMENTATION RATE: Sed Rate: 6 mm/h (ref 0–20)

## 2017-07-29 LAB — URIC ACID: Uric Acid, Serum: 4.8 mg/dL (ref 4.0–8.0)

## 2017-07-29 NOTE — Progress Notes (Signed)
IFE shows monoclonal immunoglobulin. Please refer him to hematology. Rest of the labs are normal.

## 2017-07-30 ENCOUNTER — Telehealth: Payer: Self-pay | Admitting: *Deleted

## 2017-07-30 DIAGNOSIS — R899 Unspecified abnormal finding in specimens from other organs, systems and tissues: Secondary | ICD-10-CM

## 2017-07-30 NOTE — Telephone Encounter (Signed)
-----   Message from Bo Merino, MD sent at 07/29/2017  2:07 PM EDT ----- IFE shows monoclonal immunoglobulin. Please refer him to hematology. Rest of the labs are normal.

## 2017-08-05 ENCOUNTER — Telehealth: Payer: Self-pay

## 2017-08-05 NOTE — Telephone Encounter (Signed)
Patient called to check the status of referral to hematology? Please advise.

## 2017-08-07 NOTE — Telephone Encounter (Signed)
Please call patient. Referral sent to [facility], left message for Elvina Sidle to call patient to schedule appt. Thank you.

## 2017-08-08 NOTE — Telephone Encounter (Signed)
Left message to advise patient referral has been sent and Elvina Sidle should be calling to set up appointment.

## 2017-08-10 ENCOUNTER — Telehealth: Payer: Self-pay | Admitting: Hematology and Oncology

## 2017-08-10 NOTE — Telephone Encounter (Signed)
S/w pt gave appt for 11/8 @ 11am with Dr. Lebron Conners. Lvm for Ivin Booty with referring office to advise of appt.

## 2017-08-15 ENCOUNTER — Encounter: Payer: Self-pay | Admitting: Hematology and Oncology

## 2017-08-15 ENCOUNTER — Ambulatory Visit (HOSPITAL_BASED_OUTPATIENT_CLINIC_OR_DEPARTMENT_OTHER): Payer: Medicare Other | Admitting: Hematology and Oncology

## 2017-08-15 ENCOUNTER — Ambulatory Visit (HOSPITAL_BASED_OUTPATIENT_CLINIC_OR_DEPARTMENT_OTHER): Payer: Medicare Other

## 2017-08-15 ENCOUNTER — Telehealth: Payer: Self-pay | Admitting: Hematology and Oncology

## 2017-08-15 VITALS — BP 143/84 | HR 81 | Temp 97.9°F | Resp 20 | Ht 70.0 in | Wt 197.8 lb

## 2017-08-15 DIAGNOSIS — D472 Monoclonal gammopathy: Secondary | ICD-10-CM

## 2017-08-15 LAB — CBC WITH DIFFERENTIAL/PLATELET
BASO%: 0.9 % (ref 0.0–2.0)
BASOS ABS: 0.1 10*3/uL (ref 0.0–0.1)
EOS ABS: 0.1 10*3/uL (ref 0.0–0.5)
EOS%: 1.8 % (ref 0.0–7.0)
HEMATOCRIT: 44.5 % (ref 38.4–49.9)
HEMOGLOBIN: 14.9 g/dL (ref 13.0–17.1)
LYMPH#: 1.3 10*3/uL (ref 0.9–3.3)
LYMPH%: 21 % (ref 14.0–49.0)
MCH: 32.5 pg (ref 27.2–33.4)
MCHC: 33.6 g/dL (ref 32.0–36.0)
MCV: 96.6 fL (ref 79.3–98.0)
MONO#: 0.4 10*3/uL (ref 0.1–0.9)
MONO%: 6.2 % (ref 0.0–14.0)
NEUT#: 4.2 10*3/uL (ref 1.5–6.5)
NEUT%: 70.1 % (ref 39.0–75.0)
PLATELETS: 230 10*3/uL (ref 140–400)
RBC: 4.6 10*6/uL (ref 4.20–5.82)
RDW: 14.4 % (ref 11.0–14.6)
WBC: 6 10*3/uL (ref 4.0–10.3)

## 2017-08-15 LAB — COMPREHENSIVE METABOLIC PANEL
ALT: 18 U/L (ref 0–55)
ANION GAP: 11 meq/L (ref 3–11)
AST: 25 U/L (ref 5–34)
Albumin: 4.3 g/dL (ref 3.5–5.0)
Alkaline Phosphatase: 112 U/L (ref 40–150)
BILIRUBIN TOTAL: 0.87 mg/dL (ref 0.20–1.20)
BUN: 17.8 mg/dL (ref 7.0–26.0)
CO2: 24 meq/L (ref 22–29)
Calcium: 9.5 mg/dL (ref 8.4–10.4)
Chloride: 105 mEq/L (ref 98–109)
Creatinine: 1 mg/dL (ref 0.7–1.3)
GLUCOSE: 97 mg/dL (ref 70–140)
Potassium: 4.2 mEq/L (ref 3.5–5.1)
SODIUM: 140 meq/L (ref 136–145)
TOTAL PROTEIN: 7.5 g/dL (ref 6.4–8.3)

## 2017-08-15 NOTE — Telephone Encounter (Signed)
Gave avs and calendar for November  °

## 2017-08-16 LAB — KAPPA/LAMBDA LIGHT CHAINS
IG KAPPA FREE LIGHT CHAIN: 17 mg/L (ref 3.3–19.4)
IG LAMBDA FREE LIGHT CHAIN: 14.2 mg/L (ref 5.7–26.3)
Kappa/Lambda FluidC Ratio: 1.2 (ref 0.26–1.65)

## 2017-08-19 LAB — MULTIPLE MYELOMA PANEL, SERUM
Albumin SerPl Elph-Mcnc: 3.9 g/dL (ref 2.9–4.4)
Albumin/Glob SerPl: 1.4 (ref 0.7–1.7)
Alpha 1: 0.2 g/dL (ref 0.0–0.4)
Alpha2 Glob SerPl Elph-Mcnc: 0.6 g/dL (ref 0.4–1.0)
B-Globulin SerPl Elph-Mcnc: 1.1 g/dL (ref 0.7–1.3)
Gamma Glob SerPl Elph-Mcnc: 1 g/dL (ref 0.4–1.8)
Globulin, Total: 2.9 g/dL (ref 2.2–3.9)
IGA/IMMUNOGLOBULIN A, SERUM: 220 mg/dL (ref 61–437)
IGM (IMMUNOGLOBIN M), SRM: 58 mg/dL (ref 20–172)
TOTAL PROTEIN: 6.8 g/dL (ref 6.0–8.5)

## 2017-08-23 LAB — VISCOSITY, SERUM: VISCOSITY, SERUM: 1.8 rel.saline (ref 1.6–1.9)

## 2017-08-25 ENCOUNTER — Encounter: Payer: Self-pay | Admitting: Hematology and Oncology

## 2017-08-25 DIAGNOSIS — D472 Monoclonal gammopathy: Secondary | ICD-10-CM | POA: Insufficient documentation

## 2017-08-25 NOTE — Progress Notes (Signed)
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  The pertinent presence was very faint and may constitute primary hematological disorder indication such as amyloidosis or multiple myeloma, but also may be a component of his autoimmune condition.  Plan: --Repeat blood work including full protein panel, obtain skeletal survey.  If still positive, will proceed with additional assessment such as bone marrow biopsy and amyloidosis evaluation.  If lab work is negative, will defer additional evaluations at this time --Return to clinic in 2 weeks to review the findings  Voice recognition software was used and creation of this note. Despite my best effort at editing the text, some misspelling/errors may have occurred.  Orders Placed This Encounter  Procedures  . DG Bone Survey Met    Standing Status:   Future    Standing Expiration Date:   10/15/2018    Order Specific Question:   Reason for Exam (SYMPTOM  OR DIAGNOSIS REQUIRED)    Answer:   MGUS, please eval for MM    Order Specific Question:   Preferred imaging location?    Answer:   [facility]    Order Specific Question:   Radiology Contrast Protocol - do NOT remove file path    Answer:   [email or web address]  . CBC with Differential    Standing Status:   Future    Number of Occurrences:   1    Standing Expiration Date:   08/15/2018  . Comprehensive metabolic panel    Standing Status:   Future    Number of Occurrences:   1    Standing Expiration Date:   08/15/2018  . Multiple Myeloma Panel (SPEP&IFE w/QIG)    Standing Status:   Future    Number of Occurrences:   1    Standing Expiration Date:   08/15/2018  . Kappa/lambda light chains    Standing Status:    Future    Number of Occurrences:   1    Standing Expiration Date:   08/15/2018  . Viscosity, serum    Standing Status:   Future    Number of Occurrences:   1    Standing Expiration Date:   08/15/2018    All questions were answered.  . The patient knows to call the clinic with any problems, questions or concerns.  This note was electronically signed.    History of Presenting Illness Bruce Tran 66 y.o. presenting to the [facility] for "abnormal lab results" referred by Dr Bo Merino who manages patient for inflammatory arthritis.  On review of the lab work, patient was found to have a faint monoclonal protein presence mid October 2018.  At the present time, patient reports continued discomfort in the muscles and tendons.  He reports feeling well in the morning, but after 2-3 hours he has to stop engaging in activities of daily living due to stiffness and pain in the muscles and joints.  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  Patient denies fever, chills, night sweats.  Denies any unexpected weight loss or weight gain.  Activity level has been altered by the symptoms of his arthropathy.  In addition, patient carries diagnosis of gout and raters disease.  His current treatment includes methotrexate weekly and intermittent prednisone.  Oncological/hematological History: --Labs, 07/23/17: tProt 6.6, Alb ..., Ca 9.5, Cr 1.0,  AP ...; SPEP -- faint restricted band in the gammaglobulin region, IFE positive for faint IgG kappa monoclonal immunoglobulin; WBC 6.0, Hgb 14.9, Plt 250;    Medical History: Past Medical History:  Diagnosis Date  . Borderline high cholesterol   . Gout 08/17/2016   Crystal Proven  . Osteoarthritis of right hip 08/17/2016  . Reiter's disease ([place]) 08/17/2016    Surgical History: Past Surgical History:  Procedure Laterality Date  . TONSILLECTOMY      Family History: Family History  Problem Relation Age of Onset  .  Emphysema Mother   . Emphysema Sister   . Breast cancer Sister   . Breast cancer Sister   . Psoriasis Other   . Arthritis Other        Psoriatic arthritis  . Lymphoma Other        Secondary to immunosuppression    Social History: Social History   Socioeconomic History  . Marital status: Married    Spouse name: Not on file  . Number of children: Not on file  . Years of education: Not on file  . Highest education level: Not on file  Social Needs  . Financial resource strain: Not on file  . Food insecurity - worry: Not on file  . Food insecurity - inability: Not on file  . Transportation needs - medical: Not on file  . Transportation needs - non-medical: Not on file  Occupational History  . Occupation: Risk manager  . Occupation: Retired  Tobacco Use  . Smoking status: Former Smoker    Last attempt to quit: 1988    Years since quitting: 30.9  . Smokeless tobacco: Current User    Types: Snuff, Chew  Substance and Sexual Activity  . Alcohol use: Yes    Alcohol/week: 0.6 oz    Types: 1 Cans of beer per week    Comment: occasional  . Drug use: No  . Sexual activity: Not on file  Other Topics Concern  . Not on file  Social History Narrative  . Not on file    Allergies: No Known Allergies  Medications:  Current Outpatient Medications  Medication Sig Dispense Refill  . allopurinol (ZYLOPRIM) 300 MG tablet TAKE 1 TABLET DAILY 90 tablet 1  . B-D TB SYRINGE 1CC/27GX1/2" 27G X 1/2" 1 ML MISC Inject 1 Syringe into the skin once a week. 12 each 4  . diclofenac sodium (VOLTAREN) 1 % GEL Voltaren Gel 3 grams to 3 large joints upto TID 3 TUBES with 3 refills 3 Tube 3  . folic acid (FOLVITE) 1 MG tablet TAKE 2 TABLETS DAILY 180 tablet 1  . methotrexate 50 MG/2ML injection INJECT 0.8 ML (20 MG TOTAL) UNDER THE SKIN ONCE AS DIRECTED. VIAL EXPIRES 28 DAYS AFTER INITIAL USE. 10 mL 0  . methotrexate 50 MG/2ML injection Inject 0.8 mLs (20 mg total) into the skin once a week. 10  mL 0  . omeprazole (PRILOSEC) 20 MG capsule TAKE 1 CAPSULE EVERY MORNING 90 capsule 2  . predniSONE (DELTASONE) 5 MG tablet OK to Prescribe  Prednisone '5mg'$ : 4po qAM x 4 days;3po qAM x 4 days;2po qAM x 4 days;1po qAM x 4 days;1/2po qAM x 4 days; then stop.;disp 42 pills w/ no refills.; (Patient not taking: Reported on 08/15/2017) 42 tablet 0   No current facility-administered medications for this visit.     Review of Systems: Review of Systems  Musculoskeletal: Positive for arthralgias and myalgias.  Neurological: Positive for numbness.  All other systems  reviewed and are negative.    PHYSICAL EXAMINATION Blood pressure (!) 143/84, pulse 81, temperature 97.9 F (36.6 C), temperature source Oral, resp. rate 20, height '5\' 10"'$  (1.778 m), weight 197 lb 12.8 oz (89.7 kg), SpO2 99 %.  ECOG PERFORMANCE STATUS: 1 - Symptomatic but completely ambulatory  Physical Exam  Constitutional: He is oriented to person, place, and time and well-developed, well-nourished, and in no distress. No distress.  HENT:  Head: Normocephalic and atraumatic.  Mouth/Throat: Oropharynx is clear and moist. No oropharyngeal exudate.  Eyes: Conjunctivae and EOM are normal. Pupils are equal, round, and reactive to light. No scleral icterus.  Neck: Normal range of motion. No thyromegaly present.  Cardiovascular: Normal rate, regular rhythm and normal heart sounds.  No murmur heard. Pulmonary/Chest: Effort normal and breath sounds normal. No respiratory distress. He has no wheezes. He has no rales.  Abdominal: Soft. Bowel sounds are normal. He exhibits no distension. There is no tenderness. There is no rebound and no guarding.  Musculoskeletal: Normal range of motion. He exhibits no edema.  Lymphadenopathy:    He has no cervical adenopathy.  Neurological: He is alert and oriented to person, place, and time. He has normal reflexes. No cranial nerve deficit. Coordination normal.  Skin: Skin is warm and dry. No rash noted.  He is not diaphoretic. No erythema. No pallor.     LABORATORY DATA: I have personally reviewed the data as listed: Appointment on 08/15/2017  Component Date Value Ref Range Status  . Viscosity, Serum 08/15/2017 1.8  1.6 - 1.9 rel.saline Final   Comment: Values above 2.7 may indicate paraproteinemia is present. This test was developed and its performance characteristics determined by [facility]. It has not been cleared or approved by the Food and Drug Administration.   Loletha Grayer Kappa Free Light Chain 08/15/2017 17.0  3.3 - 19.4 mg/L Final  . Ig Lambda Free Light Chain 08/15/2017 14.2  5.7 - 26.3 mg/L Final  . Kappa/Lambda FluidC Ratio 08/15/2017 1.20  0.26 - 1.65 Final  . IgG, Qn, Serum 08/15/2017 1,013  700 - 1600 mg/dL Final  . IgA, Qn, Serum 08/15/2017 220  61 - 437 mg/dL Final  . IgM, Qn, Serum 08/15/2017 58  20 - 172 mg/dL Final  . Total Protein 08/15/2017 6.8  6.0 - 8.5 g/dL Final  . Albumin SerPl Elph-Mcnc 08/15/2017 3.9  2.9 - 4.4 g/dL Final  . Alpha 1 08/15/2017 0.2  0.0 - 0.4 g/dL Final  . Alpha2 Glob SerPl Elph-Mcnc 08/15/2017 0.6  0.4 - 1.0 g/dL Final  . B-Globulin SerPl Elph-Mcnc 08/15/2017 1.1  0.7 - 1.3 g/dL Final  . Gamma Glob SerPl Elph-Mcnc 08/15/2017 1.0  0.4 - 1.8 g/dL Final  . M Protein SerPl Elph-Mcnc 08/15/2017 Not Observed  Not Observed g/dL Final  . Globulin, Total 08/15/2017 2.9  2.2 - 3.9 g/dL Final  . Albumin/Glob SerPl 08/15/2017 1.4  0.7 - 1.7 Final  . IFE 1 08/15/2017 Comment   Final   An apparent normal immunofixation pattern.  . Please Note 08/15/2017 Comment   Final   Comment: Protein electrophoresis scan will follow via computer, mail, or courier delivery.   . Sodium 08/15/2017 140  136 - 145 mEq/L Final  . Potassium 08/15/2017 4.2  3.5 - 5.1 mEq/L Final  . Chloride 08/15/2017 105  98 - 109 mEq/L Final  . CO2 08/15/2017 24  22 - 29 mEq/L Final  . Glucose 08/15/2017 97  70 - 140 mg/dl Final   Glucose reference range  is for nonfasting patients. Fasting  glucose reference range is 70- 100.  Marland Kitchen BUN 08/15/2017 17.8  7.0 - 26.0 mg/dL Final  . Creatinine 08/15/2017 1.0  0.7 - 1.3 mg/dL Final  . Total Bilirubin 08/15/2017 0.87  0.20 - 1.20 mg/dL Final  . Alkaline Phosphatase 08/15/2017 112  40 - 150 U/L Final  . AST 08/15/2017 25  5 - 34 U/L Final  . ALT 08/15/2017 18  0 - 55 U/L Final  . Total Protein 08/15/2017 7.5  6.4 - 8.3 g/dL Final  . Albumin 08/15/2017 4.3  3.5 - 5.0 g/dL Final  . Calcium 08/15/2017 9.5  8.4 - 10.4 mg/dL Final  . Anion Gap 08/15/2017 11  3 - 11 mEq/L Final  . EGFR 08/15/2017 >60  >60 ml/min/1.73 m2 Final   eGFR is calculated using the CKD-EPI Creatinine Equation (2009)  . WBC 08/15/2017 6.0  4.0 - 10.3 10e3/uL Final  . NEUT# 08/15/2017 4.2  1.5 - 6.5 10e3/uL Final  . HGB 08/15/2017 14.9  13.0 - 17.1 g/dL Final  . HCT 08/15/2017 44.5  38.4 - 49.9 % Final  . Platelets 08/15/2017 230  140 - 400 10e3/uL Final  . MCV 08/15/2017 96.6  79.3 - 98.0 fL Final  . MCH 08/15/2017 32.5  27.2 - 33.4 pg Final  . MCHC 08/15/2017 33.6  32.0 - 36.0 g/dL Final  . RBC 08/15/2017 4.60  4.20 - 5.82 10e6/uL Final  . RDW 08/15/2017 14.4  11.0 - 14.6 % Final  . lymph# 08/15/2017 1.3  0.9 - 3.3 10e3/uL Final  . MONO# 08/15/2017 0.4  0.1 - 0.9 10e3/uL Final  . Eosinophils Absolute 08/15/2017 0.1  0.0 - 0.5 10e3/uL Final  . Basophils Absolute 08/15/2017 0.1  0.0 - 0.1 10e3/uL Final  . NEUT% 08/15/2017 70.1  39.0 - 75.0 % Final  . LYMPH% 08/15/2017 21.0  14.0 - 49.0 % Final  . MONO% 08/15/2017 6.2  0.0 - 14.0 % Final  . EOS% 08/15/2017 1.8  0.0 - 7.0 % Final  . BASO% 08/15/2017 0.9  0.0 - 2.0 % Final         Ardath Sax, MD

## 2017-08-25 NOTE — Assessment & Plan Note (Signed)
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  The pertinent presence was very faint and may constitute primary hematological disorder indication such as amyloidosis or multiple myeloma, but also may be a component of his autoimmune condition.  Plan: --Repeat blood work including full protein panel, obtain skeletal survey.  If still positive, will proceed with additional assessment such as bone marrow biopsy and amyloidosis evaluation.  If lab work is negative, will defer additional evaluations at this time --Return to clinic in 2 weeks to review the findings

## 2017-08-27 ENCOUNTER — Ambulatory Visit (HOSPITAL_COMMUNITY)
Admission: RE | Admit: 2017-08-27 | Discharge: 2017-08-27 | Disposition: A | Discharge status: Home / Self Care | Payer: Medicare Other | Source: Ambulatory Visit | Attending: Hematology and Oncology | Admitting: Hematology and Oncology

## 2017-08-27 ENCOUNTER — Ambulatory Visit (HOSPITAL_BASED_OUTPATIENT_CLINIC_OR_DEPARTMENT_OTHER): Payer: Medicare Other | Admitting: Hematology and Oncology

## 2017-08-27 ENCOUNTER — Encounter: Payer: Self-pay | Admitting: Hematology and Oncology

## 2017-08-27 ENCOUNTER — Telehealth: Payer: Self-pay | Admitting: Hematology and Oncology

## 2017-08-27 VITALS — BP 141/72 | HR 89 | Temp 98.2°F | Resp 18 | Ht 70.0 in | Wt 203.8 lb

## 2017-08-27 DIAGNOSIS — M5136 Other intervertebral disc degeneration, lumbar region: Secondary | ICD-10-CM | POA: Insufficient documentation

## 2017-08-27 DIAGNOSIS — D472 Monoclonal gammopathy: Secondary | ICD-10-CM

## 2017-08-27 DIAGNOSIS — M5030 Other cervical disc degeneration, unspecified cervical region: Secondary | ICD-10-CM | POA: Insufficient documentation

## 2017-08-27 DIAGNOSIS — M2550 Pain in unspecified joint: Secondary | ICD-10-CM | POA: Diagnosis not present

## 2017-08-27 NOTE — Telephone Encounter (Signed)
return visit to be determined per los

## 2017-09-14 NOTE — Assessment & Plan Note (Signed)
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  Repeat blood work obtained in our clinic demonstrates normal protein profile including no evidence of monoclonal gammopathy.  The previously noted abnormality might have been a transient elevation of immunoglobulin secondary to rheumatoid arthritis or a reactive hypergammaglobulinemia due to recent infection   Plan: --No further follow-up with hematology indicated at this point in time.  Patient will continue his follow-up with rheumatology as previously scheduled.

## 2017-09-14 NOTE — Progress Notes (Signed)
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  Repeat blood work obtained in our clinic demonstrates normal protein profile including no evidence of monoclonal gammopathy.  The previously noted abnormality might have been a transient elevation of immunoglobulin secondary to rheumatoid arthritis or a reactive hypergammaglobulinemia due to recent infection   Plan: --No further follow-up with hematology indicated at this point in time.  Patient will continue his follow-up with rheumatology as previously scheduled.  Voice recognition software was used and creation of this note. Despite my best effort at editing the text, some misspelling/errors may have occurred.  Orders Placed This Encounter  Procedures  . DG Humerus Left    Standing Status:   Future    Standing Expiration Date:   08/27/2018    Order Specific Question:   Reason for Exam (SYMPTOM  OR DIAGNOSIS REQUIRED)    Answer:   46m lucency on the recent skeletal survey, please eval for interval change    Order Specific Question:   Preferred imaging location?    Answer:   [facility]   Order Specific Question:   Radiology Contrast Protocol - do NOT remove file path    Answer:   [email or web address]    All questions were answered.  . The patient knows to call the clinic with any problems, questions or concerns.  This note was electronically signed.    History of Presenting Illness Bruce Tran a 66y.o. [place] Dr SBo Merinowho manages patient for inflammatory arthritis.  On review of the lab work, patient was found to have a faint monoclonal protein presence mid  October 2018.  At the present time, patient reports continued discomfort in the muscles and tendons.  He reports feeling well in the morning, but after 2-3 hours he has to stop engaging in activities of daily living due to stiffness and pain in the muscles and joints.  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  Patient returns to the clinic to review results of the lab work is repeated after the last visit to the clinic.  He denies any new complaints since last visit.  Patient denies fever, chills, night sweats.  Denies any unexpected weight loss or weight gain.  Activity level has been altered by the symptoms of his arthropathy.  In addition, patient carries diagnosis of gout and raters disease.  His current treatment includes methotrexate weekly and intermittent prednisone.  Oncological/hematological History: --Labs, 07/23/17: tProt 6.6, Alb   ..., Ca 9.5, Cr 1.0, AP    ...; SPEP -- faint restricted band in the gammaglobulin region, IFE positive for faint IgG kappa monoclonal immunoglobulin;                                                                         WBC 6.0, Hgb 14.9, Plt 250;  --Labs, 08/15/17: tProt 7.5, Alb 4.3, Ca 9.5, Cr 1.0, AP 112; SPEP --negative for evidence of monoclonal protein, SIFE --normal pattern without monoclonal spike; IgG 1013, IgA  220, IgM 58; kappa 17.0, lambda 14.2, KLR 1.2; WBC 6.0, Hgb 14.9, Plt 230;   Medical History: Past Medical History:  Diagnosis Date  . Borderline high cholesterol   . Gout 08/17/2016   Crystal Proven  . Osteoarthritis of right hip 08/17/2016  . 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 TONSILLECTOMY      Family History: Family History  Problem Relation Age of Onset  . Emphysema Mother   . Emphysema Sister   . Breast Tran Sister   . Breast Tran Sister   . Psoriasis Other   . Arthritis Other        Psoriatic arthritis  . Lymphoma  Other        Secondary to immunosuppression    Social History: Social History   Socioeconomic History  . Marital status: Married    Spouse name: Not on file  . Number of children: Not on file  . Years of education: Not on file  . Highest education level: Not on file  Social Needs  . Financial resource strain: Not on file  . Food insecurity - worry: Not on file  . Food insecurity - inability: Not on file  . Transportation needs - medical: Not on file  . Transportation needs - non-medical: Not on file  Occupational History  . Occupation: Risk manager  . Occupation: Retired  Tobacco Use  . Smoking status: Former Smoker    Last attempt to quit: 1988    Years since quitting: 30.9  . Smokeless tobacco: Current User    Types: Snuff, Chew  Substance and Sexual Activity  . Alcohol use: Yes    Alcohol/week: 0.6 oz    Types: 1 Cans of beer per week    Comment: occasional  . Drug use: No  . Sexual activity: Not on file  Other Topics Concern  . Not on file  Social History Narrative  . Not on file    Allergies: No Known Allergies  Medications:  Current Outpatient Medications  Medication Sig Dispense Refill  . allopurinol (ZYLOPRIM) 300 MG tablet TAKE 1 TABLET DAILY 90 tablet 1  . B-D TB SYRINGE 1CC/27GX1/2" 27G X 1/2" 1 ML MISC Inject 1 Syringe into the skin once a week. 12 each 4  . diclofenac sodium (VOLTAREN) 1 % GEL Voltaren Gel 3 grams to 3 large joints upto TID 3 TUBES with 3 refills 3 Tube 3  . folic acid (FOLVITE) 1 MG tablet TAKE 2 TABLETS DAILY 180 tablet 1  . methotrexate 50 MG/2ML injection INJECT 0.8 ML (20 MG TOTAL) UNDER THE SKIN ONCE AS DIRECTED. VIAL EXPIRES 28 DAYS AFTER INITIAL USE. 10 mL 0  . methotrexate 50 MG/2ML injection Inject 0.8 mLs (20 mg total) into the skin once a week. 10 mL 0  . omeprazole (PRILOSEC) 20 MG capsule TAKE 1 CAPSULE EVERY MORNING 90 capsule 2  . predniSONE (DELTASONE) 5 MG tablet OK to Prescribe  Prednisone '5mg'$ : 4po qAM x 4  days;3po qAM x 4 days;2po qAM x 4 days;1po qAM x 4 days;1/2po qAM x 4 days; then stop.;disp 42 pills w/ no refills.; (Patient not taking: Reported on 08/15/2017) 42 tablet 0   No current facility-administered medications for this visit.     Review of Systems: Review of Systems  Musculoskeletal: Positive for arthralgias and myalgias.  Neurological: Positive for numbness.  All other systems reviewed and are negative.    PHYSICAL EXAMINATION Blood pressure Marland Kitchen)  141/72, pulse 89, temperature 98.2 F (36.8 C), temperature source Oral, resp. rate 18, height '5\' 10"'$  (1.778 m), weight 203 lb 12.8 oz (92.4 kg), SpO2 99 %.  ECOG PERFORMANCE STATUS: 1 - Symptomatic but completely ambulatory  Physical Exam  Constitutional: He is oriented to person, place, and time and well-developed, well-nourished, and in no distress. No distress.  HENT:  Head: Normocephalic and atraumatic.  Mouth/Throat: Oropharynx is clear and moist. No oropharyngeal exudate.  Eyes: Conjunctivae and EOM are normal. Pupils are equal, round, and reactive to light. No scleral icterus.  Neck: Normal range of motion. No thyromegaly present.  Cardiovascular: Normal rate, regular rhythm and normal heart sounds.  No murmur heard. Pulmonary/Chest: Effort normal and breath sounds normal. No respiratory distress. He has no wheezes. He has no rales.  Abdominal: Soft. Bowel sounds are normal. He exhibits no distension. There is no tenderness. There is no rebound and no guarding.  Musculoskeletal: Normal range of motion. He exhibits no edema.  Lymphadenopathy:    He has no cervical adenopathy.  Neurological: He is alert and oriented to person, place, and time. He has normal reflexes. No cranial nerve deficit. Coordination normal.  Skin: Skin is warm and dry. No rash noted. He is not diaphoretic. No erythema. No pallor.     LABORATORY DATA: I have personally reviewed the data as listed: No visits with results within 1 Week(s) from this  visit.  Latest known visit with results is:  Appointment on 08/15/2017  Component Date Value Ref Range Status  . Viscosity, Serum 08/15/2017 1.8  1.6 - 1.9 rel.saline Final   Comment: Values above 2.7 may indicate paraproteinemia is present. This test was developed and its performance characteristics determined by [facility]. It has not been cleared or approved by the Food and Drug Administration.   Loletha Grayer Kappa Free Light Chain 08/15/2017 17.0  3.3 - 19.4 mg/L Final  . Ig Lambda Free Light Chain 08/15/2017 14.2  5.7 - 26.3 mg/L Final  . Kappa/Lambda FluidC Ratio 08/15/2017 1.20  0.26 - 1.65 Final  . IgG, Qn, Serum 08/15/2017 1,013  700 - 1600 mg/dL Final  . IgA, Qn, Serum 08/15/2017 220  61 - 437 mg/dL Final  . IgM, Qn, Serum 08/15/2017 58  20 - 172 mg/dL Final  . Total Protein 08/15/2017 6.8  6.0 - 8.5 g/dL Final  . Albumin SerPl Elph-Mcnc 08/15/2017 3.9  2.9 - 4.4 g/dL Final  . Alpha 1 08/15/2017 0.2  0.0 - 0.4 g/dL Final  . Alpha2 Glob SerPl Elph-Mcnc 08/15/2017 0.6  0.4 - 1.0 g/dL Final  . B-Globulin SerPl Elph-Mcnc 08/15/2017 1.1  0.7 - 1.3 g/dL Final  . Gamma Glob SerPl Elph-Mcnc 08/15/2017 1.0  0.4 - 1.8 g/dL Final  . M Protein SerPl Elph-Mcnc 08/15/2017 Not Observed  Not Observed g/dL Final  . Globulin, Total 08/15/2017 2.9  2.2 - 3.9 g/dL Final  . Albumin/Glob SerPl 08/15/2017 1.4  0.7 - 1.7 Final  . IFE 1 08/15/2017 Comment   Final   An apparent normal immunofixation pattern.  . Please Note 08/15/2017 Comment   Final   Comment: Protein electrophoresis scan will follow via computer, mail, or courier delivery.   . Sodium 08/15/2017 140  136 - 145 mEq/L Final  . Potassium 08/15/2017 4.2  3.5 - 5.1 mEq/L Final  . Chloride 08/15/2017 105  98 - 109 mEq/L Final  . CO2 08/15/2017 24  22 - 29 mEq/L Final  . Glucose 08/15/2017 97  70 - 140 mg/dl  Final   Glucose reference range is for nonfasting patients. Fasting glucose reference range is 70- 100.  Marland Kitchen BUN 08/15/2017 17.8  7.0 - 26.0  mg/dL Final  . Creatinine 08/15/2017 1.0  0.7 - 1.3 mg/dL Final  . Total Bilirubin 08/15/2017 0.87  0.20 - 1.20 mg/dL Final  . Alkaline Phosphatase 08/15/2017 112  40 - 150 U/L Final  . AST 08/15/2017 25  5 - 34 U/L Final  . ALT 08/15/2017 18  0 - 55 U/L Final  . Total Protein 08/15/2017 7.5  6.4 - 8.3 g/dL Final  . Albumin 08/15/2017 4.3  3.5 - 5.0 g/dL Final  . Calcium 08/15/2017 9.5  8.4 - 10.4 mg/dL Final  . Anion Gap 08/15/2017 11  3 - 11 mEq/L Final  . EGFR 08/15/2017 >60  >60 ml/min/1.73 m2 Final   eGFR is calculated using the CKD-EPI Creatinine Equation (2009)  . WBC 08/15/2017 6.0  4.0 - 10.3 10e3/uL Final  . NEUT# 08/15/2017 4.2  1.5 - 6.5 10e3/uL Final  . HGB 08/15/2017 14.9  13.0 - 17.1 g/dL Final  . HCT 08/15/2017 44.5  38.4 - 49.9 % Final  . Platelets 08/15/2017 230  140 - 400 10e3/uL Final  . MCV 08/15/2017 96.6  79.3 - 98.0 fL Final  . MCH 08/15/2017 32.5  27.2 - 33.4 pg Final  . MCHC 08/15/2017 33.6  32.0 - 36.0 g/dL Final  . RBC 08/15/2017 4.60  4.20 - 5.82 10e6/uL Final  . RDW 08/15/2017 14.4  11.0 - 14.6 % Final  . lymph# 08/15/2017 1.3  0.9 - 3.3 10e3/uL Final  . MONO# 08/15/2017 0.4  0.1 - 0.9 10e3/uL Final  . Eosinophils Absolute 08/15/2017 0.1  0.0 - 0.5 10e3/uL Final  . Basophils Absolute 08/15/2017 0.1  0.0 - 0.1 10e3/uL Final  . NEUT% 08/15/2017 70.1  39.0 - 75.0 % Final  . LYMPH% 08/15/2017 21.0  14.0 - 49.0 % Final  . MONO% 08/15/2017 6.2  0.0 - 14.0 % Final  . EOS% 08/15/2017 1.8  0.0 - 7.0 % Final  . BASO% 08/15/2017 0.9  0.0 - 2.0 % Final       Ardath Sax, MD

## 2017-10-05 NOTE — Progress Notes (Signed)
Office Visit Note  Patient: Bruce Tran             Date of Birth: Jan 28, 1951           MRN: [id number]             PCP: System, Pcp Not In Referring: No ref. provider found Visit Date: 10/17/2017 Occupation: @GUAROCC @    Subjective:  Joint pain.   History of Present Illness: Bruce Tran is a 66 y.o. male  with history of Reiter's disease and osteoarthritis. He states she's been having pain and discomfort in his right hip joint. He has underlying osteoarthritis. He was recently evaluated by his orthopedic surgeon in [place] were recommended total hip replacement. It will be scheduled sometimes around June. Due to his right hip pain he's been having increased lower back pain. He states she's been taking ibuprofen 600 mg twice a day which is not controlling his symptoms. He was on a trip recently were he had some leftover prednisone and he took it taper of prednisone starting at 8 mg and tapering at 1 mg over the next 12 days. Which helped his symptoms. He denies any joint swelling currently. He has not had a gout flare. His been tolerating all his medications without any problems.  Activities of Daily Living:  Patient reports morning stiffness for 15  minutes.   Patient Reports nocturnal pain.  Difficulty dressing/grooming: Reports Difficulty climbing stairs: Reports Difficulty getting out of chair: Reports Difficulty using hands for taps, buttons, cutlery, and/or writing: Reports   Review of Systems  Constitutional: Negative for fatigue, night sweats and weakness ( ).  HENT: Negative for mouth sores, mouth dryness and nose dryness.   Eyes: Negative for redness and dryness.  Respiratory: Negative for cough, shortness of breath and difficulty breathing.   Cardiovascular: Negative for chest pain, palpitations, hypertension, irregular heartbeat and swelling in legs/feet.  Gastrointestinal: Negative for blood in stool, constipation and diarrhea.  Endocrine: Negative for  increased urination.  Genitourinary: Negative for hematuria and urgency.  Musculoskeletal: Positive for arthralgias, joint pain and joint swelling. Negative for myalgias, muscle weakness, morning stiffness, muscle tenderness and myalgias.  Skin: Negative for color change, rash, hair loss, nodules/bumps, skin tightness, ulcers and sensitivity to sunlight.  Allergic/Immunologic: Negative for susceptible to infections.  Neurological: Negative for dizziness, fainting, light-headedness, numbness, memory loss and night sweats.  Hematological: Negative for swollen glands.  Psychiatric/Behavioral: Negative for depressed mood and sleep disturbance. The patient is not nervous/anxious.     PMFS History:  Patient Active Problem List   Diagnosis Date Noted  . On prednisone therapy 06/03/2017  . Inflammatory arthritis 01/02/2017  . Neck pain 01/02/2017  . Encounter for long-term current use of high risk medication 01/02/2017  . High risk medication use 01/02/2017  . Gout 08/17/2016  . 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 Osteoarthritis of right hip 08/17/2016    Past Medical History:  Diagnosis Date  . Borderline high cholesterol   . Gout 08/17/2016   Crystal Proven  . Osteoarthritis of right hip 08/17/2016  . 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 Emphysema Mother   . Emphysema Sister   . Breast cancer Sister   . Breast cancer Sister   . Psoriasis Other   . Arthritis Other        Psoriatic arthritis  . Lymphoma Other        Secondary to immunosuppression   Past Surgical History:  Procedure Laterality Date  . TONSILLECTOMY     Social History   Social History Narrative  . Not on file     Objective: Vital Signs: BP 136/75   Pulse 82   Resp 14   Ht 5\' 10"  (1.778 m)   Wt 207 lb (93.9 kg)   BMI 29.70 kg/m    Physical Exam  Constitutional: He is oriented to person, place, and time. He appears well-developed and well-nourished.    HENT:  Head: Normocephalic and atraumatic.  Eyes: Conjunctivae and EOM are normal. Pupils are equal, round, and reactive to light.  Neck: Normal range of motion. Neck supple.  Cardiovascular: Normal rate, regular rhythm and normal heart sounds.  Pulmonary/Chest: Effort normal and breath sounds normal.  Abdominal: Soft. Bowel sounds are normal.  Neurological: He is alert and oriented to person, place, and time.  Skin: Skin is warm and dry. Capillary refill takes less than 2 seconds.  Psychiatric: He has a normal mood and affect. His behavior is normal.  Nursing note and vitals reviewed.    Musculoskeletal Exam: C-spine and thoracic lumbar spine good range of motion. He has some discomfort range of motion of his lumbar spine. Shoulder joints with good range of motion. His bilateral elbow joint contractures. He has DIP PIP thickening in his hands but no synovitis was noted. Right hip joint is very limited painful range of motion. Left hip joint was good range of motion. His knee joints are good range of motion. He is some tenderness across MTPs but no synovitis was noted.  CDAI Exam: CDAI Homunculus Exam:   Tenderness:  RLE: acetabulofemoral  Joint Counts:  CDAI Tender Joint count: 0 CDAI Swollen Joint count: 0  Global Assessments:  Patient Global Assessment: 5 Provider Global Assessment: 3  CDAI Calculated Score: 8    Investigation: No additional findings. CBC Latest Ref Rng & Units 08/15/2017 07/23/2017 04/29/2017  WBC 4.0 - 10.3 10e3/uL 6.0 4.7 5.9  Hemoglobin 13.0 - 17.1 g/dL 14.9 14.3 15.1  Hematocrit 38.4 - 49.9 % 44.5 41.4 45.1  Platelets 140 - 400 10e3/uL 230 250 219   CMP Latest Ref Rng & Units 08/15/2017 08/15/2017 07/23/2017  Glucose 70 - 140 mg/dl 97 - -  BUN 7.0 - 26.0 mg/dL 17.8 - -  Creatinine 0.7 - 1.3 mg/dL 1.0 - -  Sodium 136 - 145 mEq/L 140 - -  Potassium 3.5 - 5.1 mEq/L 4.2 - -  Chloride 98 - 110 mmol/L - - -  CO2 22 - 29 mEq/L 24 - -  Calcium 8.4 -  10.4 mg/dL 9.5 - -  Total Protein 6.0 - 8.5 g/dL 7.5 6.8 6.7  Total Bilirubin 0.20 - 1.20 mg/dL 0.87 - -  Alkaline Phos 40 - 150 U/L 112 - -  AST 5 - 34 U/L 25 - -  ALT 0 - 55 U/L 18 - -    Imaging: Xr Foot 2 Views Left  Result Date: 10/17/2017 First MTP all PIP/DIP narrowing was noted. Some calcification was noted around the fifth MTP joint. No erosive changes were noted. No intertarsal joint space narrowing was noted. Inferior and posterior calcaneal spurs were noted. Impression: These funds are consistent with osteoarthritis and inflammatory arthritis.  Xr Foot 2 Views Right  Result Date: 10/17/2017 First MTP narrowing PIP/DIP narrowing was noted. Some calcification was noted around the first MTP and first PIP joint. No erosive changes were noted. No intertarsal joint space narrowing was noted. Inferior and posterior calcaneal spurs were noted.  Impression: These findings are consistent with osteoarthritis and gout.  Xr Hand 2 View Left  Result Date: 10/17/2017 Juxta articular osteopenia was noted. First and third MCP  joint narrowing was noted. All PIP and DIP joint space narrowing was noted. Intercarpal joint space narrowing was noted. No erosive changes were noted. Impression: These findings are consistent with inflammatory arthritis and osteoarthritis overlap.  Xr Hand 2 View Right  Result Date: 10/17/2017 Juxta articular osteopenia was noted. First and third MCP  joint narrowing was noted. All PIP and DIP joint space narrowing was noted. Intercarpal joint space narrowing was noted. No erosive changes were noted. Impression: These findings are consistent with inflammatory arthritis and osteoarthritis overlap.   Speciality Comments: No specialty comments available.    Procedures:  No procedures performed Allergies: Patient has no known allergies.   Assessment / Plan:     Visit Diagnoses: Reiter's disease of multiple sites [facility]) - history of inflammatory arthritis. He has no  synovitis on examination. He appears to be doing quite well on methotrexate.  High risk medication use - Methotrexate 0.8 ML subcutaneous every week, folic acid 2 mg by mouth daily. His labs have been stable we will continue to monitor his labs every 3 months.  Pain in both hands -he has been having pain and stiffness in his hands was no synovitis was noted. I believe his symptoms are coming from underlying osteoarthritis. Plan: XR Hand 2 View Right, XR Hand 2 View Left  Primary osteoarthritis of both hands  Primary osteoarthritis of right hip: He has severe osteoarthritis and is a schedule for total knee replacement later this summer.  Pain in both feet . No synovitis was noted.- Plan: XR Foot 2 Views Right, XR Foot 2 Views Left  On prednisone therapy intermittently. He's been taking prednisone mostly for his back and right hip. Hopefully with the total hip replacement he would not need prednisone therapy. Prednisone taper April 2018. Patient is taking long-term prednisone. He was given a prednisone taper in April. He also took another prednisone taper himself in December for his lower back pain.  Idiopathic chronic gout of multiple sites without tophus - On allopurinol 300 mg by mouth daily. His uric acid was 4.8 in October 2018.  Screening for osteoporosis - Last DEXA normal 12/08/2010 [facility] in [place]      Orders: Orders Placed This Encounter  Procedures  . XR Hand 2 View Right  . XR Hand 2 View Left  . XR Foot 2 Views Right  . XR Foot 2 Views Left   No orders of the defined types were placed in this encounter.   Face-to-face time spent with patient was 30 minutes. Greater than 50% of time was spent in counseling and coordination of care.  Follow-Up Instructions: Return in about 5 months (around 03/17/2018) for Rheumatoid arthritis.   Bo Merino, MD  Note - This record has been created using Editor, commissioning.  Chart creation errors have been sought,  but may not always  have been located. Such creation errors do not reflect on  the standard of medical care.

## 2017-10-17 ENCOUNTER — Encounter: Payer: Self-pay | Admitting: Rheumatology

## 2017-10-17 ENCOUNTER — Ambulatory Visit (INDEPENDENT_AMBULATORY_CARE_PROVIDER_SITE_OTHER): Payer: Medicare Other

## 2017-10-17 ENCOUNTER — Ambulatory Visit (INDEPENDENT_AMBULATORY_CARE_PROVIDER_SITE_OTHER): Payer: Self-pay

## 2017-10-17 ENCOUNTER — Ambulatory Visit (INDEPENDENT_AMBULATORY_CARE_PROVIDER_SITE_OTHER): Payer: Medicare Other | Admitting: Rheumatology

## 2017-10-17 VITALS — BP 136/75 | HR 82 | Resp 14 | Ht 70.0 in | Wt 207.0 lb

## 2017-10-17 DIAGNOSIS — M0239 Reiter's disease, multiple sites: Secondary | ICD-10-CM | POA: Diagnosis not present

## 2017-10-17 DIAGNOSIS — M19042 Primary osteoarthritis, left hand: Secondary | ICD-10-CM | POA: Diagnosis not present

## 2017-10-17 DIAGNOSIS — Z7952 Long term (current) use of systemic steroids: Secondary | ICD-10-CM

## 2017-10-17 DIAGNOSIS — M79672 Pain in left foot: Secondary | ICD-10-CM

## 2017-10-17 DIAGNOSIS — Z79899 Other long term (current) drug therapy: Secondary | ICD-10-CM

## 2017-10-17 DIAGNOSIS — Z13820 Encounter for screening for osteoporosis: Secondary | ICD-10-CM

## 2017-10-17 DIAGNOSIS — M79642 Pain in left hand: Secondary | ICD-10-CM

## 2017-10-17 DIAGNOSIS — M79671 Pain in right foot: Secondary | ICD-10-CM

## 2017-10-17 DIAGNOSIS — M1611 Unilateral primary osteoarthritis, right hip: Secondary | ICD-10-CM | POA: Diagnosis not present

## 2017-10-17 DIAGNOSIS — M19041 Primary osteoarthritis, right hand: Secondary | ICD-10-CM | POA: Diagnosis not present

## 2017-10-17 DIAGNOSIS — M791 Myalgia, unspecified site: Secondary | ICD-10-CM | POA: Diagnosis not present

## 2017-10-17 DIAGNOSIS — M79641 Pain in right hand: Secondary | ICD-10-CM | POA: Diagnosis not present

## 2017-10-17 DIAGNOSIS — M1A09X0 Idiopathic chronic gout, multiple sites, without tophus (tophi): Secondary | ICD-10-CM | POA: Diagnosis not present

## 2017-10-17 NOTE — Patient Instructions (Signed)
Standing Labs We placed an order today for your standing lab work.    Please come back and get your standing labs in February and every 3 months  We have open lab Monday through Friday from 8:30-11:30 AM and 1:30-4 PM at the office of Dr. Collin Rengel.   The office is located at [address], [address], [place], [place] [address] No appointment is necessary.   Labs are drawn by [facility].  You may receive a bill from [facility] for your lab work. If you have any questions regarding directions or hours of operation,  please call [phone].    

## 2017-12-02 ENCOUNTER — Other Ambulatory Visit: Payer: Self-pay

## 2017-12-02 ENCOUNTER — Other Ambulatory Visit: Payer: Self-pay | Admitting: *Deleted

## 2017-12-02 DIAGNOSIS — Z79899 Other long term (current) drug therapy: Secondary | ICD-10-CM

## 2017-12-02 LAB — CBC WITH DIFFERENTIAL/PLATELET
BASOS PCT: 1.2 %
Basophils Absolute: 68 cells/uL (ref 0–200)
EOS PCT: 1.9 %
Eosinophils Absolute: 108 cells/uL (ref 15–500)
HEMATOCRIT: 45 % (ref 38.5–50.0)
HEMOGLOBIN: 15.8 g/dL (ref 13.2–17.1)
Lymphs Abs: 1516 cells/uL (ref 850–3900)
MCH: 33.2 pg — ABNORMAL HIGH (ref 27.0–33.0)
MCHC: 35.1 g/dL (ref 32.0–36.0)
MCV: 94.5 fL (ref 80.0–100.0)
MPV: 10.6 fL (ref 7.5–12.5)
Monocytes Relative: 6.5 %
NEUTROS ABS: 3637 {cells}/uL (ref 1500–7800)
Neutrophils Relative %: 63.8 %
Platelets: 238 10*3/uL (ref 140–400)
RBC: 4.76 10*6/uL (ref 4.20–5.80)
RDW: 12.8 % (ref 11.0–15.0)
Total Lymphocyte: 26.6 %
WBC: 5.7 10*3/uL (ref 3.8–10.8)
WBCMIX: 371 {cells}/uL (ref 200–950)

## 2017-12-02 LAB — COMPLETE METABOLIC PANEL WITH GFR
AG Ratio: 1.7 (calc) (ref 1.0–2.5)
ALBUMIN MSPROF: 4.3 g/dL (ref 3.6–5.1)
ALKALINE PHOSPHATASE (APISO): 92 U/L (ref 40–115)
ALT: 16 U/L (ref 9–46)
AST: 22 U/L (ref 10–35)
BUN: 15 mg/dL (ref 7–25)
CO2: 29 mmol/L (ref 20–32)
CREATININE: 1.16 mg/dL (ref 0.70–1.25)
Calcium: 9.5 mg/dL (ref 8.6–10.3)
Chloride: 104 mmol/L (ref 98–110)
GFR, Est African American: 75 mL/min/{1.73_m2} (ref >=60)
GFR, Est Non African American: 64 mL/min/{1.73_m2} (ref >=60)
GLUCOSE: 82 mg/dL (ref 65–99)
Globulin: 2.5 g/dL (calc) (ref 1.9–3.7)
Potassium: 4.8 mmol/L (ref 3.5–5.3)
Sodium: 138 mmol/L (ref 135–146)
Total Bilirubin: 0.6 mg/dL (ref 0.2–1.2)
Total Protein: 6.8 g/dL (ref 6.1–8.1)

## 2017-12-12 ENCOUNTER — Other Ambulatory Visit: Payer: Self-pay | Admitting: Rheumatology

## 2017-12-12 NOTE — Telephone Encounter (Signed)
Last Visit:10/17/17 Next Visit: 03/26/18 Labs: 12/02/17 WNL  Okay to refill per Dr. Estanislado Pandy

## 2018-03-12 NOTE — Progress Notes (Signed)
Office Visit Note  Patient: Bruce Tran             Date of Birth: 03-07-1951           MRN: [id number]             PCP: Quentin Cornwall, MD Referring: No ref. provider found Visit Date: 03/26/2018 Occupation: @GUAROCC @    Subjective:  Arthritis (THR - right 05/01/18, needs to discuss medications, currently not on MTX due to head cold x 4 weeks, feeling better)   History of Present Illness: Bruce Tran is a 67 y.o. male with Reiter's disease, osteoarthritis of bilateral hands, right hip osteoarthritis, chronic gout presented for follow-up visit.  Stated that he stopped taking methotrexate 4 to 5 weeks ago due to a head cold and he has not resumed it.  He is scheduled for hip replacement on May 01, 2018 he wants to know whether to hold methotrexate and allopurinol prior to surgery.  He states that his bilateral lower extremities are feeling better after holding methotrexate.  Sides his right hip none of the other joints are painful today.  He has not had a gout flare in a long time.  Activities of Daily Living:  Patient reports morning stiffness for 24 hours.   Patient Denies nocturnal pain.  Difficulty dressing/grooming: Denies Difficulty climbing stairs: Reports Difficulty getting out of chair: Denies Difficulty using hands for taps, buttons, cutlery, and/or writing: Denies   Review of Systems  Constitutional: Positive for activity change and fatigue. Negative for night sweats.  HENT: Negative for mouth sores, mouth dryness and nose dryness.   Eyes: Negative for redness and dryness.  Respiratory: Negative for shortness of breath and difficulty breathing.   Cardiovascular: Negative for chest pain, palpitations, hypertension, irregular heartbeat and swelling in legs/feet.  Gastrointestinal: Negative for constipation and diarrhea.  Endocrine: Negative for excessive thirst and increased urination.  Genitourinary: Negative for difficulty urinating.  Musculoskeletal: Positive  for arthralgias, joint pain and morning stiffness. Negative for joint swelling, myalgias, muscle weakness, muscle tenderness and myalgias.  Skin: Negative for color change, rash, hair loss, nodules/bumps, skin tightness, ulcers and sensitivity to sunlight.  Allergic/Immunologic: Negative for susceptible to infections.  Neurological: Negative for dizziness, fainting, numbness, memory loss, night sweats and weakness ( ).  Hematological: Negative for bruising/bleeding tendency and swollen glands.  Psychiatric/Behavioral: Negative for depressed mood and sleep disturbance. The patient is not nervous/anxious.     PMFS History:  Patient Active Problem List   Diagnosis Date Noted  . On prednisone therapy 06/03/2017  . Inflammatory arthritis 01/02/2017  . Neck pain 01/02/2017  . Encounter for long-term current use of high risk medication 01/02/2017  . High risk medication use 01/02/2017  . Gout 08/17/2016  . Reiter's disease ([place]) 08/17/2016  . Osteoarthritis of right hip 08/17/2016    Past Medical History:  Diagnosis Date  . Borderline high cholesterol   . Gout 08/17/2016   Crystal Proven  . Osteoarthritis of right hip 08/17/2016  . Reiter's disease ([place]) 08/17/2016    Family History  Problem Relation Age of Onset  . Emphysema Mother   . Emphysema Sister   . Breast cancer Sister   . Breast cancer Sister   . Psoriasis Other   . Arthritis Other        Psoriatic arthritis  . Lymphoma Other        Secondary to immunosuppression   Past Surgical History:  Procedure Laterality Date  . TONSILLECTOMY  Social History   Social History Narrative  . Not on file     Objective: Vital Signs: BP 123/76 (BP Location: Left Arm, Patient Position: Sitting, Cuff Size: Normal)   Pulse 70   Resp 14   Ht 5' 10.5" (1.791 m)   Wt 210 lb (95.3 kg)   BMI 29.71 kg/m    Physical Exam  Constitutional: He is oriented to person, place, and time. He appears well-developed and well-nourished.    HENT:  Head: Normocephalic and atraumatic.  Eyes: Pupils are equal, round, and reactive to light. Conjunctivae and EOM are normal.  Neck: Normal range of motion. Neck supple.  Cardiovascular: Normal rate, regular rhythm and normal heart sounds.  Pulmonary/Chest: Effort normal and breath sounds normal.  Abdominal: Soft. Bowel sounds are normal.  Neurological: He is alert and oriented to person, place, and time.  Skin: Skin is warm and dry. Capillary refill takes less than 2 seconds.  Psychiatric: He has a normal mood and affect. His behavior is normal.  Nursing note and vitals reviewed.    Musculoskeletal Exam: C-spine, thoracic and lumbar spine good range of motion.  Shoulder joints were in good range of motion.  He has bilateral elbow joint contractures which are old.  He has some DIP and PIP thickening but no MCP changes were noted.  He has limited range of motion of her right hip joint.  Knee joints and ankles were in good range of motion without synovitis.  CDAI Exam: CDAI Homunculus Exam:   Joint Counts:  CDAI Tender Joint count: 0 CDAI Swollen Joint count: 0  Global Assessments:  Patient Global Assessment: 2 Provider Global Assessment: 2  CDAI Calculated Score: 4    Investigation: No additional findings.Uric acid: 07/2017 4.8 CBC Latest Ref Rng & Units 12/02/2017 08/15/2017 07/23/2017  WBC 3.8 - 10.8 Thousand/uL 5.7 6.0 4.7  Hemoglobin 13.2 - 17.1 g/dL 15.8 14.9 14.3  Hematocrit 38.5 - 50.0 % 45.0 44.5 41.4  Platelets 140 - 400 Thousand/uL 238 230 250   CMP Latest Ref Rng & Units 12/02/2017 08/15/2017 08/15/2017  Glucose 65 - 99 mg/dL 82 97 -  BUN 7 - 25 mg/dL 15 17.8 -  Creatinine 0.70 - 1.25 mg/dL 1.16 1.0 -  Sodium 135 - 146 mmol/L 138 140 -  Potassium 3.5 - 5.3 mmol/L 4.8 4.2 -  Chloride 98 - 110 mmol/L 104 - -  CO2 20 - 32 mmol/L 29 24 -  Calcium 8.6 - 10.3 mg/dL 9.5 9.5 -  Total Protein 6.1 - 8.1 g/dL 6.8 7.5 6.8  Total Bilirubin 0.2 - 1.2 mg/dL 0.6 0.87 -   Alkaline Phos 40 - 150 U/L - 112 -  AST 10 - 35 U/L 22 25 -  ALT 9 - 46 U/L 16 18 -    Imaging: No results found.  Speciality Comments: No specialty comments available.    Procedures:  No procedures performed Allergies: Patient has no known allergies.   Assessment / Plan:     Visit Diagnoses: Reiter's disease of multiple sites [facility]) - history of inflammatory arthritis.  He has been doing well on methotrexate.  He did not have to use prednisone in a long time.  He discontinued methotrexate recently due to sinus infection.  He has hip joint surgery coming up and he does not wish to go back on methotrexate.  We had discussion that he may resume methotrexate 1 week after the surgery if there is no infection and healing is going well.  High  risk medication use - Methotrexate 0.8 ML subcutaneous every week, folic acid 2 mg by mouth daily - Plan: CBC with Differential/Platelet, COMPLETE METABOLIC PANEL WITH GFR today and then every 3 months.  Primary osteoarthritis of both hands-he has some DIP and PIP thickening with minimal discomfort.  And protection muscle strengthening discussed.  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  Primary osteoarthritis of right hip-he has limited range of motion and has surgery scheduled next month.  Idiopathic chronic gout of multiple sites without tophus - On allopurinol 300 mg by mouth daily. His uric acid was 4.8 in October 2018. - Plan: Uric acid today.  He has not had a gout flare in a long time.  Screening for osteoporosis -  Last DEXA normal 12/08/2010 [facility] in [place].  He would prefer to have a bone density later this year.       Orders: Orders Placed This Encounter  Procedures  . CBC with Differential/Platelet  . COMPLETE METABOLIC PANEL WITH GFR  . Uric acid   No orders of the defined types were placed in this encounter.   Face-to-face time spent with patient was 30 minutes.  >50% of time was spent in counseling and coordination of care.  Follow-Up Instructions: Return in about 5 months (around 08/26/2018) for Reiter's, OA , Gout.   Bo Merino, MD  Note - This record has been created using Editor, commissioning.  Chart creation errors have been sought, but may not always  have been located. Such creation errors do not reflect on  the standard of medical care.

## 2018-03-26 ENCOUNTER — Ambulatory Visit (INDEPENDENT_AMBULATORY_CARE_PROVIDER_SITE_OTHER): Payer: Medicare Other | Admitting: Rheumatology

## 2018-03-26 ENCOUNTER — Encounter: Payer: Self-pay | Admitting: Rheumatology

## 2018-03-26 VITALS — BP 123/76 | HR 70 | Resp 14 | Ht 70.5 in | Wt 210.0 lb

## 2018-03-26 DIAGNOSIS — M24529 Contracture, unspecified elbow: Secondary | ICD-10-CM | POA: Diagnosis not present

## 2018-03-26 DIAGNOSIS — Z13820 Encounter for screening for osteoporosis: Secondary | ICD-10-CM

## 2018-03-26 DIAGNOSIS — M1A09X0 Idiopathic chronic gout, multiple sites, without tophus (tophi): Secondary | ICD-10-CM | POA: Diagnosis not present

## 2018-03-26 DIAGNOSIS — Z79899 Other long term (current) drug therapy: Secondary | ICD-10-CM

## 2018-03-26 DIAGNOSIS — M1611 Unilateral primary osteoarthritis, right hip: Secondary | ICD-10-CM

## 2018-03-26 DIAGNOSIS — M0239 Reiter's disease, multiple sites: Secondary | ICD-10-CM

## 2018-03-26 DIAGNOSIS — M19041 Primary osteoarthritis, right hand: Secondary | ICD-10-CM

## 2018-03-26 DIAGNOSIS — M19042 Primary osteoarthritis, left hand: Secondary | ICD-10-CM

## 2018-03-26 NOTE — Patient Instructions (Signed)
Standing Labs We placed an order today for your standing lab work.    Please come back and get your standing labs in September and every 3 months   We have open lab Monday through Friday from 8:30-11:30 AM and 1:30-4:00 PM  at the office of Dr. Katrinka Herbison.   You may experience shorter wait times on Monday and Friday afternoons. The office is located at [address], [address], [place], [place] [address] No appointment is necessary.   Labs are drawn by [facility].  You may receive a bill from [facility] for your lab work. If you have any questions regarding directions or hours of operation,  please call [phone].    

## 2018-03-26 NOTE — Progress Notes (Deleted)
Consult Note  Patient: Bruce Tran             Date of Birth: 08-28-1951           MRN: [id number]             Visit Date: 03/26/2018  Requested by: No referring provider defined for this encounter. PCP: Quentin Cornwall, MD  Thank you for asking me to evaluate this patient for Arthritis (THR - right 05/01/18, needs to discuss medications, currently not on MTX due to head cold x 4 weeks, feeling better)  Below are my findings.   Assessment: Visit Diagnoses: Reiter's disease of multiple sites [facility]) - history of inflammatory arthritis.  High risk medication use - Methotrexate 0.8 ML subcutaneous every week, folic acid 2 mg by mouth daily  Primary osteoarthritis of both hands  Primary osteoarthritis of right hip  On prednisone therapy - intermittently.  Idiopathic chronic gout of multiple sites without tophus - On allopurinol 300 mg by mouth daily. His uric acid was 4.8 in October 2018.  Screening for osteoporosis -  Last DEXA normal 12/08/2010 [facility] in [place] Instructions: No follow-ups on file.  Orders: No orders of the defined types were placed in this encounter.  No orders of the defined types were placed in this encounter.     Subjective:   Chief Complaint: Arthritis (THR - right 05/01/18, needs to discuss medications, currently not on MTX due to head cold x 4 weeks, feeling better)   Allergies: Patient has no known allergies.  Activities of Daily Living: ***   History of Present Illness: Bruce Tran is a 67 y.o. male   with reiter's disease, osteoarthritis of bilateral hands and right hip, idiopathic chronic gout who presents for follow up visit.  The patient stated that he stopped taking methotrexate 4-5 weeks ago due to having a head cold. Hip replaced on July 25th 2019 so wants to know whether to hold methotrexate and allopurinol prior to his surgery. States that his legs are feeling better.   Review of Systems: Review of  Systems  Constitutional: Negative for appetite change.  Musculoskeletal: Positive for arthralgias (right hip), joint pain (right hip) and morning stiffness. Negative for joint swelling.    Investigation: No additional findings.     Objective: Vital Signs: BP 123/76 (BP Location: Left Arm, Patient Position: Sitting, Cuff Size: Normal)   Pulse 70   Resp 14   Ht 5' 10.5" (1.791 m)   Wt 210 lb (95.3 kg)   BMI 29.71 kg/m   Physical Exam   Musculoskeletal Exam:***  CDAI Exam:  No CDAI exam completed.  CDAI comments:  Speciality Comments: No specialty comments available.  Imaging: No results found.  Problem List:  Patient Active Problem List   Diagnosis Date Noted  . On prednisone therapy 06/03/2017  . Inflammatory arthritis 01/02/2017  . Neck pain 01/02/2017  . Encounter for long-term current use of high risk medication 01/02/2017  . High risk medication use 01/02/2017  . Gout 08/17/2016  . Reiter's disease ([place]) 08/17/2016  . Osteoarthritis of right hip 08/17/2016      PMFS History:  History: Past Medical History:  Diagnosis Date  . Borderline high cholesterol   . Gout 08/17/2016   Crystal Proven  . Osteoarthritis of right hip 08/17/2016  . Reiter's disease ([place]) 08/17/2016    Family History  Problem Relation Age of Onset  . Emphysema Mother   . Emphysema Sister   .  Breast cancer Sister   . Breast cancer Sister   . Psoriasis Other   . Arthritis Other        Psoriatic arthritis  . Lymphoma Other        Secondary to immunosuppression   Past Surgical History:  Procedure Laterality Date  . TONSILLECTOMY     Social History   Social History Narrative  . Not on file     Procedures: No procedures performed  Lars Mage, MD  Note - This record has been created using [employer]. Chart creation errors have been sought, but may not always have been located. Such creation errors do not reflect on the standard of medical care.

## 2018-04-07 HISTORY — PX: JOINT REPLACEMENT: SHX530

## 2018-04-29 IMAGING — DX DG BONE SURVEY MET
9 of 10 series · 9 of 10 positions shown · non-contrast
Comparison: None in PACs

CLINICAL DATA: Monoclonal gammopathy of uncertain significance. Mid
right-sided back pain. No injury.

EXAM:
METASTATIC BONE SURVEY

[skull lat]
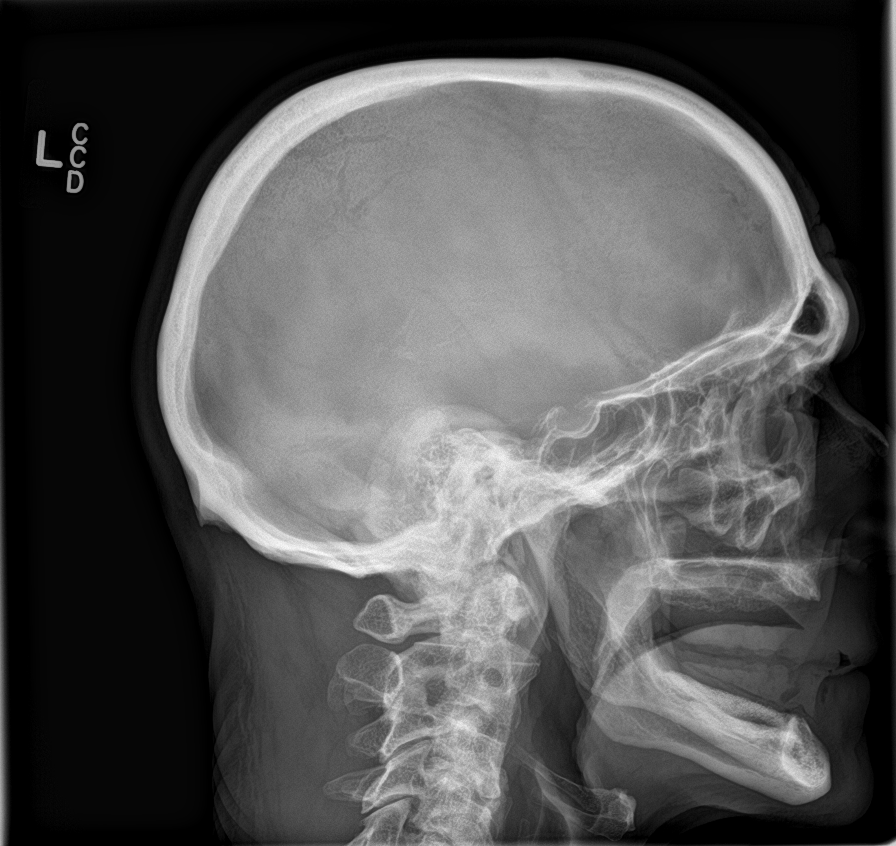

[shoulder ap (1 of 2)]
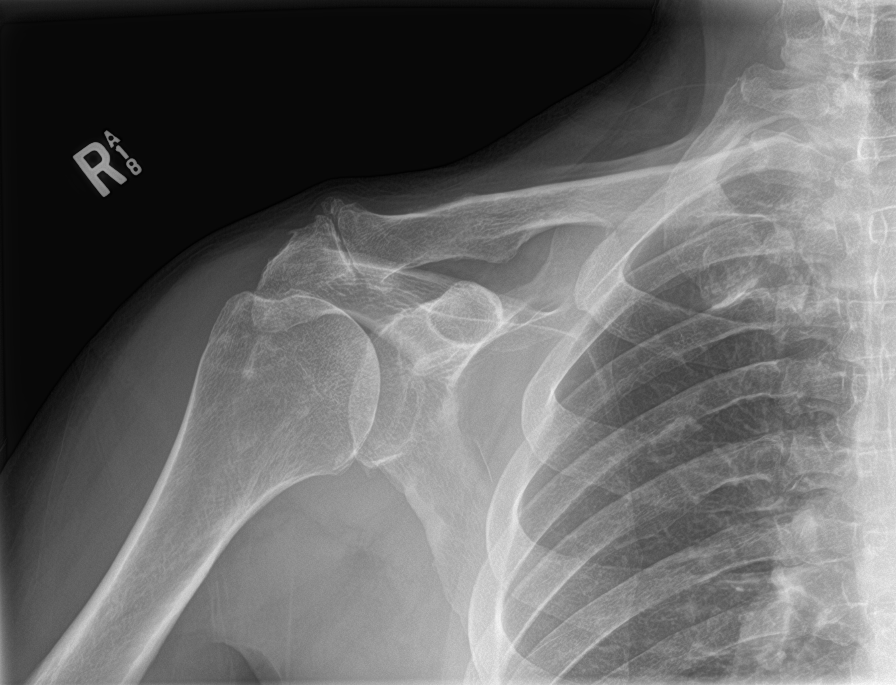

[shoulder ap (2 of 2)]
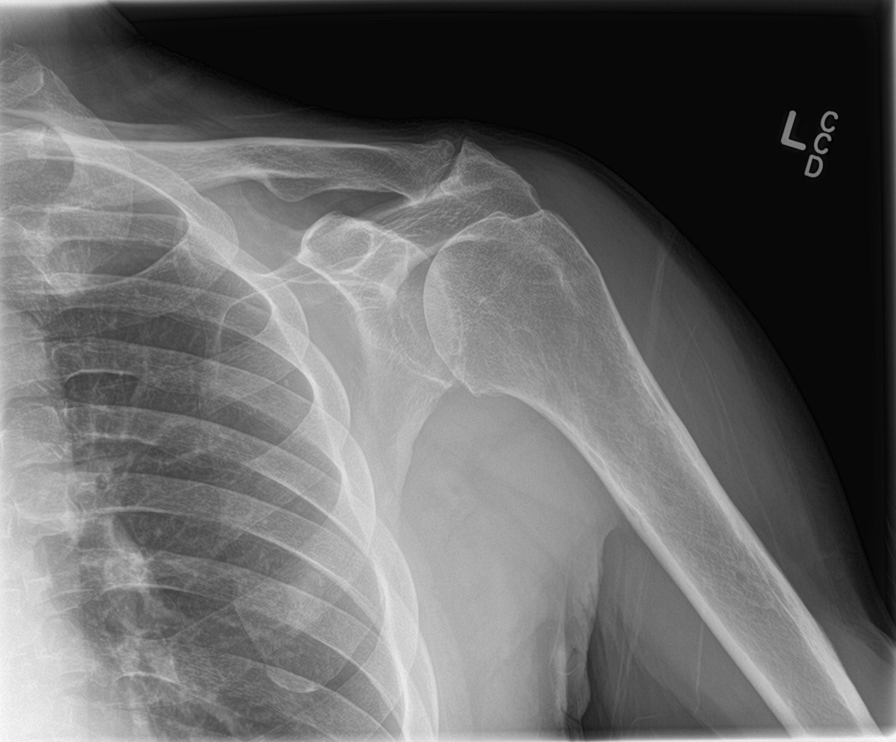

[humerus ap (1 of 2)]
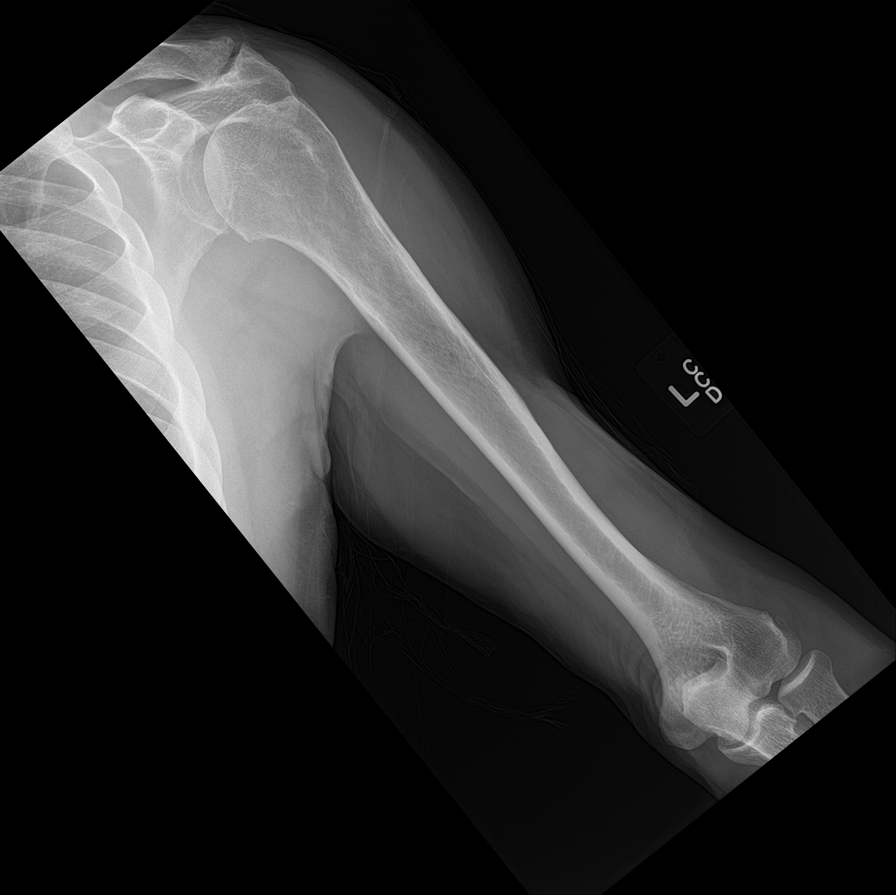

[humerus ap (2 of 2)]
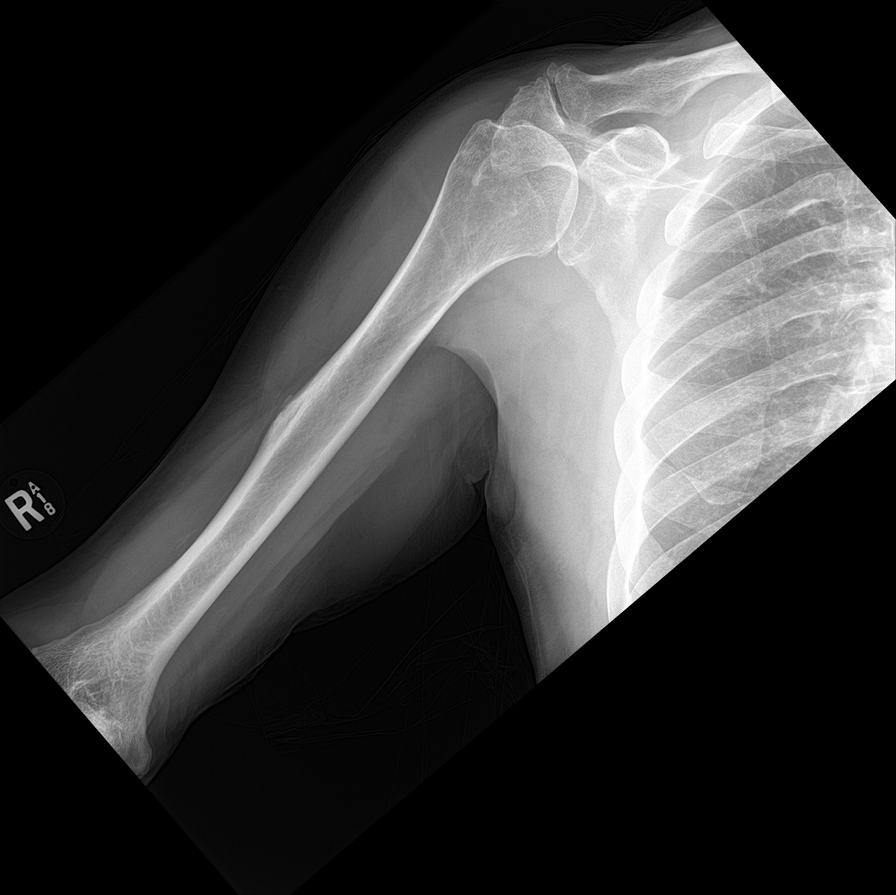

[forearm ap (1 of 2)]
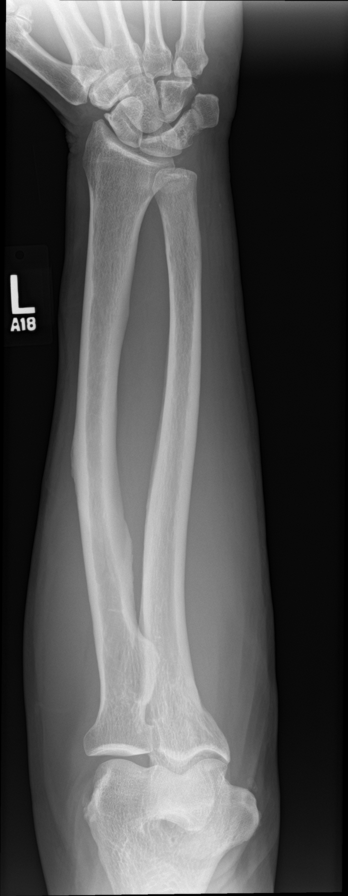

[forearm ap (2 of 2)]
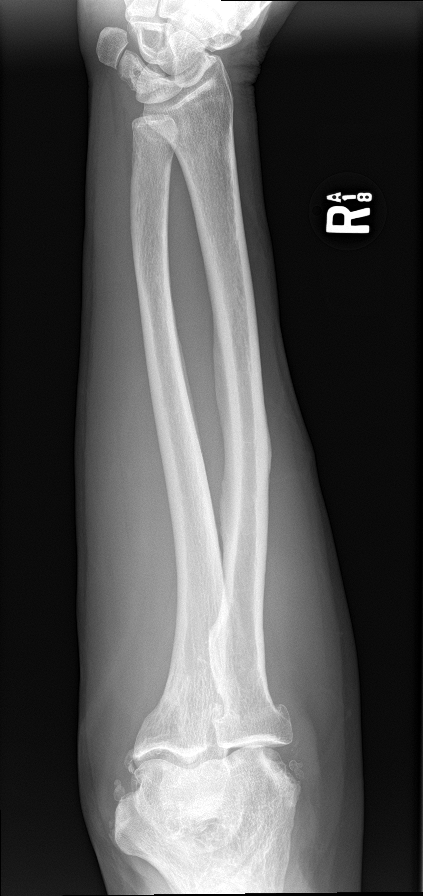

[c-spine ap]
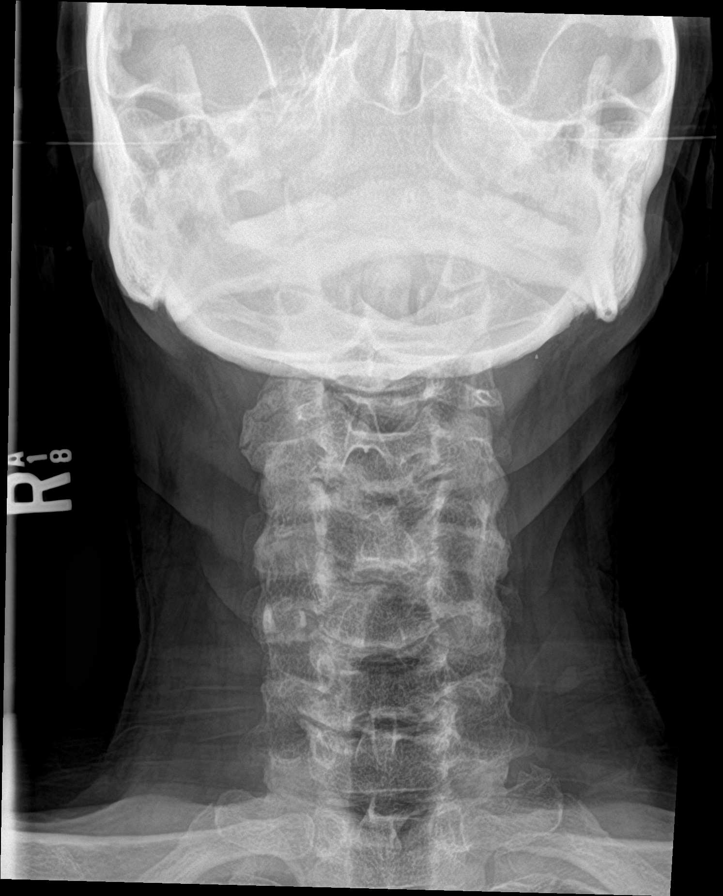

[c-spine lat]
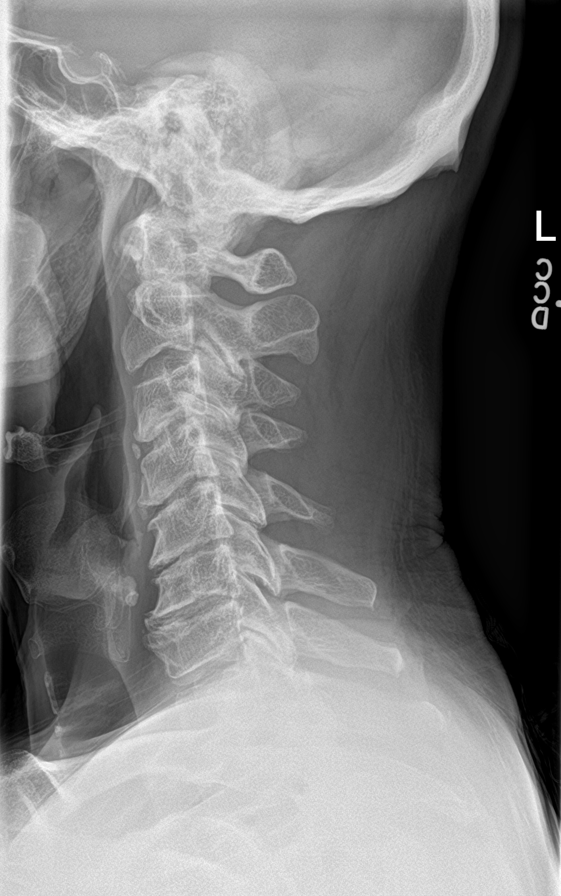

[9 of 10 positions shown; findings below may reference images not displayed]

FINDINGS: Chest x-ray: The lungs are adequately inflated. There is increased
density that projects over the anterior aspect of the left fifth
rib. The heart and pulmonary vascularity are normal. There is no
pleural effusion.

Calvarium: No lytic nor blastic lesion is observed.

Spine: There is multilevel degenerative disc space narrowing of the
cervical spine. The thoracic and lumbar spines exhibit no
compression fractures. There is mild-to-moderate degenerative disc
disease of the lumbar spine greatest at L4-5. No lytic nor blastic
lesions of the vertebral bodies are observed.

Pelvis and lower extremities: There are degenerative changes of the
right hip. No lytic or blastic pelvic lesion is observed. No lytic
or blastic lower extremity bony lesions are observed.

Pectoral girdle: There are degenerative changes of the AC joints
bilaterally. There is narrowing of the subacromial subdeltoid spaces
bilaterally. There is an approximately 3 mm diameter lucency in the
midshaft of the left humerus. No abnormalities are observed in the
forearms.
IMPRESSION: 3 mm diameter lucency in the midshaft of the left humerus. No
definite abnormal lucencies observed elsewhere in the skeleton.

Degenerative changes of the cervical and lumbar spines, both
shoulders, and right hip.

## 2018-07-08 LAB — COMPLETE METABOLIC PANEL WITH GFR
AG RATIO: 1.6 (calc) (ref 1.0–2.5)
ALBUMIN MSPROF: 4.2 g/dL (ref 3.6–5.1)
ALT: 12 U/L (ref 9–46)
AST: 19 U/L (ref 10–35)
Alkaline phosphatase (APISO): 90 U/L (ref 40–115)
BUN: 17 mg/dL (ref 7–25)
CALCIUM: 9.7 mg/dL (ref 8.6–10.3)
CO2: 29 mmol/L (ref 20–32)
CREATININE: 1.08 mg/dL (ref 0.70–1.25)
Chloride: 104 mmol/L (ref 98–110)
GFR, EST AFRICAN AMERICAN: 81 mL/min/{1.73_m2} (ref >=60)
GFR, EST NON AFRICAN AMERICAN: 70 mL/min/{1.73_m2} (ref >=60)
GLOBULIN: 2.7 g/dL (ref 1.9–3.7)
Glucose, Bld: 94 mg/dL (ref 65–99)
POTASSIUM: 4.8 mmol/L (ref 3.5–5.3)
SODIUM: 141 mmol/L (ref 135–146)
TOTAL PROTEIN: 6.9 g/dL (ref 6.1–8.1)
Total Bilirubin: 0.5 mg/dL (ref 0.2–1.2)

## 2018-07-08 LAB — CBC WITH DIFFERENTIAL/PLATELET
BASOS ABS: 63 {cells}/uL (ref 0–200)
BASOS PCT: 1 %
EOS ABS: 189 {cells}/uL (ref 15–500)
Eosinophils Relative: 3 %
HCT: 43.9 % (ref 38.5–50.0)
Hemoglobin: 15.4 g/dL (ref 13.2–17.1)
Lymphs Abs: 1852 cells/uL (ref 850–3900)
MCH: 33 pg (ref 27.0–33.0)
MCHC: 35.1 g/dL (ref 32.0–36.0)
MCV: 94 fL (ref 80.0–100.0)
MPV: 10.6 fL (ref 7.5–12.5)
Monocytes Relative: 8.6 %
NEUTROS ABS: 3654 {cells}/uL (ref 1500–7800)
Neutrophils Relative %: 58 %
Platelets: 213 10*3/uL (ref 140–400)
RBC: 4.67 10*6/uL (ref 4.20–5.80)
RDW: 12.7 % (ref 11.0–15.0)
Total Lymphocyte: 29.4 %
WBC: 6.3 10*3/uL (ref 3.8–10.8)
WBCMIX: 542 {cells}/uL (ref 200–950)

## 2018-07-08 LAB — URIC ACID: URIC ACID, SERUM: 5.7 mg/dL (ref 4.0–8.0)

## 2018-08-04 NOTE — Progress Notes (Signed)
Office Visit Note  Patient: Bruce Tran             Date of Birth: 04-16-1951           MRN: [id number]             PCP: Quentin Cornwall, MD Referring: Quentin Cornwall, MD Visit Date: 08/18/2018 Occupation: @GUAROCC @  Subjective:  Medication management.  History of Present Illness: Bruce Tran is a 67 y.o. male with history of Reiter's, osteoarthritis and gout.  He states he decided to come off methotrexate in May and has not taking it since then.  He had a right total hip replacement in July.  He states after the replacement he developed fever and high WBC count.  He was given antibiotics and the white cell count improved and he was discharged on p.o. antibiotics.  Since then he has done well without any recurrence of fever.  His right total hip replacement is doing well.  He has had no flares of Reiter's or gout.  He states he has been taking allopurinol intermittently.  In the last month he has been taking allopurinol on a regular basis.  His right middle finger is not been as bothersome.  Activities of Daily Living:  Patient reports morning stiffness for 30 minutes.   Patient Denies nocturnal pain.  Difficulty dressing/grooming: Denies Difficulty climbing stairs: Denies Difficulty getting out of chair: Denies Difficulty using hands for taps, buttons, cutlery, and/or writing: Denies  Review of Systems  Constitutional: Negative for fatigue and night sweats.  HENT: Negative for mouth sores, mouth dryness and nose dryness.   Eyes: Negative for redness and dryness.  Respiratory: Negative for shortness of breath and difficulty breathing.   Cardiovascular: Negative for chest pain, palpitations, hypertension, irregular heartbeat and swelling in legs/feet.  Gastrointestinal: Negative for constipation and diarrhea.  Endocrine: Negative for increased urination.  Musculoskeletal: Positive for arthralgias, joint pain and morning stiffness. Negative for joint swelling, myalgias, muscle  weakness, muscle tenderness and myalgias.  Skin: Negative for color change, rash, hair loss, nodules/bumps, skin tightness, ulcers and sensitivity to sunlight.  Allergic/Immunologic: Negative for susceptible to infections.  Neurological: Negative for dizziness, fainting, memory loss, night sweats and weakness ( ).  Hematological: Negative for swollen glands.  Psychiatric/Behavioral: Negative for depressed mood and sleep disturbance. The patient is not nervous/anxious.     PMFS History:  Patient Active Problem List   Diagnosis Date Noted  . On prednisone therapy 06/03/2017  . Inflammatory arthritis 01/02/2017  . Neck pain 01/02/2017  . Encounter for long-term current use of high risk medication 01/02/2017  . High risk medication use 01/02/2017  . Gout 08/17/2016  . Reiter's disease ([place]) 08/17/2016  . Osteoarthritis of right hip 08/17/2016    Past Medical History:  Diagnosis Date  . Borderline high cholesterol   . Gout 08/17/2016   Crystal Proven  . Osteoarthritis of right hip 08/17/2016  . Reiter's disease ([place]) 08/17/2016    Family History  Problem Relation Age of Onset  . Emphysema Mother   . Emphysema Sister   . Breast cancer Sister   . Breast cancer Sister   . Psoriasis Other   . Arthritis Other        Psoriatic arthritis  . Lymphoma Other        Secondary to immunosuppression   Past Surgical History:  Procedure Laterality Date  . JOINT REPLACEMENT Left 04/2018   total hip   . TONSILLECTOMY     Social History  Social History Narrative  . Not on file    Objective: Vital Signs: BP (!) 153/95 (BP Location: Left Arm, Patient Position: Sitting, Cuff Size: Normal)   Pulse 80   Resp 14   Ht 5' 10.5" (1.791 m)   Wt 204 lb 12.8 oz (92.9 kg)   BMI 28.97 kg/m    Physical Exam  Constitutional: He is oriented to person, place, and time. He appears well-developed and well-nourished.  HENT:  Head: Normocephalic and atraumatic.  Eyes: Pupils are equal, round, and  reactive to light. Conjunctivae and EOM are normal.  Neck: Normal range of motion. Neck supple.  Cardiovascular: Normal rate, regular rhythm and normal heart sounds.  Pulmonary/Chest: Effort normal and breath sounds normal.  Abdominal: Soft. Bowel sounds are normal.  Neurological: He is alert and oriented to person, place, and time.  Skin: Skin is warm and dry. Capillary refill takes less than 2 seconds.  Psychiatric: He has a normal mood and affect. His behavior is normal.  Nursing note and vitals reviewed.    Musculoskeletal Exam: C-spine thoracic lumbar spine good range of motion.  Shoulder joints in good range of motion.  He had bilateral elbow joint contractures which are old.  He has osteoarthritis in his hands with DIP and PIP thickening.  He has bilateral fourth finger Dupuytren's contracture.  Right total hip replacement is doing well.  Knee joints were in good range of motion without warmth swelling or effusion.  He has good range of motion of his ankles MTPs PIPs.  CDAI Exam: CDAI Score: Not documented Patient Global Assessment: Not documented; Provider Global Assessment: Not documented Swollen: Not documented; Tender: Not documented Joint Exam   Not documented   There is currently no information documented on the homunculus. Go to the Rheumatology activity and complete the homunculus joint exam.  Investigation: No additional findings.  Imaging: No results found.  Recent Labs: Lab Results  Component Value Date   WBC 6.3 03/26/2018   HGB 15.4 03/26/2018   PLT 213 03/26/2018   NA 141 03/26/2018   K 4.8 03/26/2018   CL 104 03/26/2018   CO2 29 03/26/2018   GLUCOSE 94 03/26/2018   BUN 17 03/26/2018   CREATININE 1.08 03/26/2018   BILITOT 0.5 03/26/2018   ALKPHOS 112 08/15/2017   AST 19 03/26/2018   ALT 12 03/26/2018   PROT 6.9 03/26/2018   ALBUMIN 4.3 08/15/2017   CALCIUM 9.7 03/26/2018   GFRAA 81 03/26/2018  July 02, 2018 uric acid 6.4, CBC normal, CMP  normal, UA negative  Speciality Comments: No specialty comments available.  Procedures:  No procedures performed Allergies: Patient has no known allergies.   Assessment / Plan:     Visit Diagnoses: Reiter's disease of multiple sites ([facility])-patient has no synovitis on examination.  He has been off methotrexate since May.  He has not taken prednisone.  He denies any flares.  We will observe for right now.  Patient will contact us in case he develops a flare.  High risk medication use -patient has discontinued methotrexate.  He will stay off the medication for right now.  He brought some labs done by his PCP which we reviewed.  CBC, CMP were within normal limits which patient brought with him.  Primary osteoarthritis of both hands-he has some osteoarthritic changes in his hands and has very good grip strength.  He does have bilateral ring finger Dupuytren's contractures.  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  Idiopathic chronic gout of multiple sites without tophus - allopurinol 300 mg by mouth daily. His uric acid was 4.8 in October 2018. -His most recent uric acid was 6.3.  Patient states he has not been taking allopurinol on a regular basis.  Orders: No orders of the defined types were placed in this encounter.  No orders of the defined types were placed in this encounter.   Face-to-face time spent with patient was  minutes. Greater than 50% of time was spent in counseling and coordination of care.  Follow-Up Instructions: Return in about 6 months (around 02/16/2019) for Osteoarthritis, Gout, Reiter's.   Bo Merino, MD  Note - This record has been created using Editor, commissioning.  Chart creation errors have been sought, but may not always  have been located. Such creation errors do not reflect on  the standard of medical care.

## 2018-08-18 ENCOUNTER — Ambulatory Visit (INDEPENDENT_AMBULATORY_CARE_PROVIDER_SITE_OTHER): Payer: Medicare Other | Admitting: Rheumatology

## 2018-08-18 ENCOUNTER — Encounter: Payer: Self-pay | Admitting: Physician Assistant

## 2018-08-18 VITALS — BP 153/95 | HR 80 | Resp 14 | Ht 70.5 in | Wt 204.8 lb

## 2018-08-18 DIAGNOSIS — M1A09X0 Idiopathic chronic gout, multiple sites, without tophus (tophi): Secondary | ICD-10-CM

## 2018-08-18 DIAGNOSIS — M0239 Reiter's disease, multiple sites: Secondary | ICD-10-CM | POA: Diagnosis not present

## 2018-08-18 DIAGNOSIS — Z79899 Other long term (current) drug therapy: Secondary | ICD-10-CM

## 2018-08-18 DIAGNOSIS — M19041 Primary osteoarthritis, right hand: Secondary | ICD-10-CM

## 2018-08-18 DIAGNOSIS — M19042 Primary osteoarthritis, left hand: Secondary | ICD-10-CM

## 2018-08-18 DIAGNOSIS — M24529 Contracture, unspecified elbow: Secondary | ICD-10-CM | POA: Diagnosis not present

## 2018-08-18 DIAGNOSIS — Z96641 Presence of right artificial hip joint: Secondary | ICD-10-CM

## 2018-08-25 ENCOUNTER — Ambulatory Visit: Payer: Medicare Other | Admitting: Physician Assistant

## 2018-09-23 ENCOUNTER — Other Ambulatory Visit: Payer: Self-pay | Admitting: Rheumatology

## 2018-09-23 ENCOUNTER — Telehealth: Payer: Self-pay | Admitting: Rheumatology

## 2018-09-23 NOTE — Telephone Encounter (Signed)
Per patient, Dr. Estanislado Pandy told patient he would not have to have labs again until his next appt in May. Patient needs a refill on his medication. He wants to make clear he gave doctor a copy of his lab work at his last appt. Please call to discuss.

## 2018-09-23 NOTE — Telephone Encounter (Addendum)
Last Visit: 08/18/18 Next Visit: 02/09/19 Labs: 03/26/18 CBC and CMP WNL.   Left message to advise patient he is due update labs.   Okay to refill per Dr. Estanislado Pandy

## 2018-09-24 NOTE — Telephone Encounter (Signed)
Patient advised that the labs have been received and at the scan center to be scanned. Patient advised we did send in a 30 day supply and will send in a 90 day supply with the next refill.

## 2018-11-04 ENCOUNTER — Other Ambulatory Visit: Payer: Self-pay | Admitting: Rheumatology

## 2018-11-04 MED ORDER — ALLOPURINOL 300 MG PO TABS: 300.0000 mg | ORAL_TABLET | Freq: Every day | ORAL | 0 refills | Status: DC

## 2018-11-04 NOTE — Telephone Encounter (Signed)
Last Visit: 08/18/18 Next visit: 02/09/19 Labs: 09/26/18 WNL  Okay to refill per Dr. Deveshwar 

## 2019-01-22 ENCOUNTER — Other Ambulatory Visit: Payer: Self-pay | Admitting: Rheumatology

## 2019-01-22 NOTE — Telephone Encounter (Signed)
Last Visit: 08/18/18 Next visit: 02/09/19 Labs: 09/26/18 WNL  Okay to refill per Dr. Estanislado Pandy

## 2019-02-02 NOTE — Progress Notes (Signed)
Virtual Visit via Video Note  I connected with Bruce Tran on 02/04/19 at  9:45 AM EDT by a video enabled telemedicine application and verified that I am speaking with the correct person using two identifiers.   I discussed the limitations of evaluation and management by telemedicine and the availability of in person appointments. The patient expressed understanding and agreed to proceed. This service was conducted via virtual visit.  Both audio and visual tools were used.  The patient was located at home. I was located in my office.  Consent was obtained prior to the virtual visit and is aware of possible charges through their insurance for this visit.  The patient is an established patient.  Dr. Estanislado Pandy, MD conducted the virtual visit and Hazel Sams, PA-C acted as scribe during the service.  Office staff helped with scheduling follow up visits after the service was conducted.     CC: Medication monitoring   History of Present Illness: Patient is a 68 year old male with a past medical history of Reiter's, osteoarthritis, and gout.  He is no longer taking MTX-d/c in May 2019.  He is taking Allopurinol 300 mg po daily for management of gout. He denies any joint pain or joint swelling. He has been doing stretching exercises daily.  He has felt considerably better since discontinuing MTX.  He has been able to perform physical activities with no difficulty.    Review of Systems  Constitutional: Negative for fever and malaise/fatigue.  Eyes: Negative for photophobia, pain, discharge and redness.  Respiratory: Negative for cough, shortness of breath and wheezing.   Cardiovascular: Negative for chest pain and palpitations.  Gastrointestinal: Negative for blood in stool, constipation and diarrhea.  Genitourinary: Negative for dysuria.  Musculoskeletal: Negative for back pain, joint pain, myalgias and neck pain.  Skin: Negative for rash.  Neurological: Negative for dizziness and headaches.   Psychiatric/Behavioral: Negative for depression. The patient is not nervous/anxious and does not have insomnia.       Observations/Objective: Physical Exam  Constitutional: He is oriented to person, place, and time and well-developed, well-nourished, and in no distress.  HENT:  Head: Normocephalic and atraumatic.  Eyes: Conjunctivae are normal.  Pulmonary/Chest: Effort normal.  Neurological: He is alert and oriented to person, place, and time.  Psychiatric: Mood, memory, affect and judgment normal.   Patient reports morning stiffness for 5 minutes.   Patient denies nocturnal pain.  Difficulty dressing/grooming: Denies Difficulty climbing stairs: Denies Difficulty getting out of chair: Denies Difficulty using hands for taps, buttons, cutlery, and/or writing: Denies  Assessment and Plan: Reiter's disease of multiple sites [facility])- He has not had any recent flares or recurrences.  He has no joint pain or joint swelling at this time.  He has morning stiffness for about 5 minutes.  He performs stretching exercises daily. He has been off methotrexate since May 2019.  He feels considerably better since discontinuing MTX. He was advised to notify us if he develops any signs or symptoms of a flare.   He will follow up in 4 months.   High risk medication use -He discontinued MTX in May 2019.   Primary osteoarthritis of both hands-He experiences some stiffness in both hands.  He has no pain or joint swelling at this time.  He has complete fist formation bilaterally.  He has no difficulty with ADLs.  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  He performs stretching exercises daily.  He  has no discomfort at this time.   Idiopathic chronic gout of multiple sites without tophus -He has not had any recent gout flares.  He is taking allopurinol 300 mg by mouth daily. Uric acid was 5.7 on 03/27/18.  Future orders for CBC, CMP, and  uric acid were placed today. He is apprehensive to have lab work drawn during the [place] pandemic. He was advised to notify us if he develops signs or symptoms of a flare.    Follow Up Instructions: He will follow up in 4 months.  Future orders for CBC, CMP, and uric acid were placed today.    I discussed the assessment and treatment plan with the patient. The patient was provided an opportunity to ask questions and all were answered. The patient agreed with the plan and demonstrated an understanding of the instructions.   The patient was advised to call back or seek an in-person evaluation if the symptoms worsen or if the condition fails to improve as anticipated.  I provided 25 minutes of non-face-to-face time during this encounter. Bo Merino, MD   Scribed by-  Ofilia Neas, PA-C

## 2019-02-04 ENCOUNTER — Encounter: Payer: Self-pay | Admitting: Rheumatology

## 2019-02-04 ENCOUNTER — Telehealth (INDEPENDENT_AMBULATORY_CARE_PROVIDER_SITE_OTHER): Payer: Medicare Other | Admitting: Rheumatology

## 2019-02-04 ENCOUNTER — Other Ambulatory Visit: Payer: Self-pay

## 2019-02-04 DIAGNOSIS — M19042 Primary osteoarthritis, left hand: Secondary | ICD-10-CM

## 2019-02-04 DIAGNOSIS — M1A09X0 Idiopathic chronic gout, multiple sites, without tophus (tophi): Secondary | ICD-10-CM

## 2019-02-04 DIAGNOSIS — M0239 Reiter's disease, multiple sites: Secondary | ICD-10-CM

## 2019-02-04 DIAGNOSIS — M19041 Primary osteoarthritis, right hand: Secondary | ICD-10-CM | POA: Diagnosis not present

## 2019-02-04 DIAGNOSIS — Z79899 Other long term (current) drug therapy: Secondary | ICD-10-CM | POA: Diagnosis not present

## 2019-02-04 DIAGNOSIS — M24529 Contracture, unspecified elbow: Secondary | ICD-10-CM

## 2019-02-04 DIAGNOSIS — M1611 Unilateral primary osteoarthritis, right hip: Secondary | ICD-10-CM

## 2019-02-04 DIAGNOSIS — Z96641 Presence of right artificial hip joint: Secondary | ICD-10-CM

## 2019-02-09 ENCOUNTER — Ambulatory Visit: Payer: Medicare Other | Admitting: Physician Assistant

## 2019-04-01 ENCOUNTER — Other Ambulatory Visit: Payer: Self-pay

## 2019-04-01 DIAGNOSIS — Z79899 Other long term (current) drug therapy: Secondary | ICD-10-CM

## 2019-04-01 DIAGNOSIS — M1A09X0 Idiopathic chronic gout, multiple sites, without tophus (tophi): Secondary | ICD-10-CM

## 2019-04-02 LAB — COMPLETE METABOLIC PANEL WITH GFR
AG Ratio: 1.6 (calc) (ref 1.0–2.5)
ALT: 15 U/L (ref 9–46)
AST: 20 U/L (ref 10–35)
Albumin: 4.1 g/dL (ref 3.6–5.1)
Alkaline phosphatase (APISO): 99 U/L (ref 35–144)
BUN: 20 mg/dL (ref 7–25)
CO2: 28 mmol/L (ref 20–32)
Calcium: 9.4 mg/dL (ref 8.6–10.3)
Chloride: 108 mmol/L (ref 98–110)
Creat: 0.9 mg/dL (ref 0.70–1.25)
GFR, Est African American: 101 mL/min/{1.73_m2} (ref >=60)
GFR, Est Non African American: 87 mL/min/{1.73_m2} (ref >=60)
Globulin: 2.5 g/dL (calc) (ref 1.9–3.7)
Glucose, Bld: 103 mg/dL — ABNORMAL HIGH (ref 65–99)
Potassium: 5 mmol/L (ref 3.5–5.3)
Sodium: 138 mmol/L (ref 135–146)
Total Bilirubin: 0.4 mg/dL (ref 0.2–1.2)
Total Protein: 6.6 g/dL (ref 6.1–8.1)

## 2019-04-02 LAB — CBC WITH DIFFERENTIAL/PLATELET
Absolute Monocytes: 422 cells/uL (ref 200–950)
Basophils Absolute: 68 cells/uL (ref 0–200)
Basophils Relative: 1 %
Eosinophils Absolute: 184 cells/uL (ref 15–500)
Eosinophils Relative: 2.7 %
HCT: 41.5 % (ref 38.5–50.0)
Hemoglobin: 14.3 g/dL (ref 13.2–17.1)
Lymphs Abs: 2067 cells/uL (ref 850–3900)
MCH: 31.7 pg (ref 27.0–33.0)
MCHC: 34.5 g/dL (ref 32.0–36.0)
MCV: 92 fL (ref 80.0–100.0)
MPV: 10.4 fL (ref 7.5–12.5)
Monocytes Relative: 6.2 %
Neutro Abs: 4060 cells/uL (ref 1500–7800)
Neutrophils Relative %: 59.7 %
Platelets: 230 10*3/uL (ref 140–400)
RBC: 4.51 10*6/uL (ref 4.20–5.80)
RDW: 12.8 % (ref 11.0–15.0)
Total Lymphocyte: 30.4 %
WBC: 6.8 10*3/uL (ref 3.8–10.8)

## 2019-04-02 LAB — URIC ACID: Uric Acid, Serum: 5 mg/dL (ref 4.0–8.0)

## 2019-04-02 NOTE — Progress Notes (Signed)
CBC and CMP WNL.  Uric acid is within desirable range.

## 2019-04-24 ENCOUNTER — Other Ambulatory Visit: Payer: Self-pay | Admitting: Rheumatology

## 2019-04-24 NOTE — Telephone Encounter (Signed)
Last Visit: 02/04/2019 telemedicine  Next Visit: 06/02/2019 Labs: 04/01/2019 CBC and CMP WNL. Uric acid is within desirable range  Okay to refill per Dr. Estanislado Pandy.

## 2019-05-19 NOTE — Progress Notes (Signed)
Office Visit Note  Patient: Bruce Tran             Date of Birth: 12-20-1950           MRN: [id number]             PCP: Quentin Cornwall, MD Referring: Quentin Cornwall, MD Visit Date: 06/02/2019 Occupation: @GUAROCC @  Subjective:  Medication monitoring   History of Present Illness: Bruce Tran is a 68 y.o. male with history of Reiter's, gout, and osteoarthritis.  He is taking Allopurinol 300 mg 1 tablet by mouth daily. He denies any recent gout flares. He denies any flares of Reiter syndrome.  He has not had any recent rashes, sores in his mouth or nose, eye inflammation, joint swelling.  He has been off of methotrexate since May 2019.  He is felt significantly better since coming off of methotrexate.  He has no fatigue at this time.  He has occasional arthralgias and joint stiffness but is manageable.  He has not had any joint swelling.  He is occasional pain in bilateral knee joints and bilateral wrist.  He does have lower back pain but denies any symptoms of radiculopathy.  He states that his joint pain improves with activity.  He uses voltaren gel topically as needed for pain relief.      Activities of Daily Living:  Patient reports morning stiffness for 2  minutes.   Patient Denies nocturnal pain.  Difficulty dressing/grooming: Denies Difficulty climbing stairs: Denies Difficulty getting out of chair: Denies Difficulty using hands for taps, buttons, cutlery, and/or writing: Denies  Review of Systems  Constitutional: Negative for fatigue and night sweats.  HENT: Negative for mouth sores, mouth dryness and nose dryness.   Eyes: Negative for redness, visual disturbance and dryness.  Respiratory: Negative for cough, hemoptysis and shortness of breath.   Cardiovascular: Negative for chest pain, palpitations, hypertension, irregular heartbeat and swelling in legs/feet.  Gastrointestinal: Negative for blood in stool, constipation and diarrhea.  Endocrine: Negative for increased  urination.  Genitourinary: Negative for painful urination.  Musculoskeletal: Positive for arthralgias, joint pain and morning stiffness. Negative for joint swelling, myalgias, muscle weakness, muscle tenderness and myalgias.  Skin: Negative for color change, rash, hair loss, nodules/bumps, skin tightness, ulcers and sensitivity to sunlight.  Allergic/Immunologic: Negative for susceptible to infections.  Neurological: Negative for dizziness, fainting, memory loss, night sweats and weakness.  Hematological: Negative for swollen glands.  Psychiatric/Behavioral: Negative for depressed mood and sleep disturbance. The patient is not nervous/anxious.     PMFS History:  Patient Active Problem List   Diagnosis Date Noted  . On prednisone therapy 06/03/2017  . Inflammatory arthritis 01/02/2017  . Neck pain 01/02/2017  . Encounter for long-term current use of high risk medication 01/02/2017  . High risk medication use 01/02/2017  . Gout 08/17/2016  . Reiter's disease ([place]) 08/17/2016  . Osteoarthritis of right hip 08/17/2016    Past Medical History:  Diagnosis Date  . Borderline high cholesterol   . Gout 08/17/2016   Crystal Proven  . Osteoarthritis of right hip 08/17/2016  . Reiter's disease ([place]) 08/17/2016    Family History  Problem Relation Age of Onset  . Emphysema Mother   . Emphysema Sister   . Breast cancer Sister   . Breast cancer Sister   . Psoriasis Other   . Arthritis Other        Psoriatic arthritis  . Lymphoma Other        Secondary to immunosuppression  Past Surgical History:  Procedure Laterality Date  . JOINT REPLACEMENT Left 04/2018   total hip   . TONSILLECTOMY     Social History   Social History Narrative  . Not on file    There is no immunization history on file for this patient.   Objective: Vital Signs: BP 129/72 (BP Location: Left Arm, Patient Position: Sitting, Cuff Size: Normal)   Pulse 70   Resp 15   Ht 5' 10.5" (1.791 m)   Wt 194 lb 6.4  oz (88.2 kg)   BMI 27.50 kg/m    Physical Exam Vitals signs and nursing note reviewed.  Constitutional:      Appearance: He is well-developed.  HENT:     Head: Normocephalic and atraumatic.  Eyes:     Conjunctiva/sclera: Conjunctivae normal.     Pupils: Pupils are equal, round, and reactive to light.  Neck:     Musculoskeletal: Normal range of motion and neck supple.  Cardiovascular:     Rate and Rhythm: Normal rate and regular rhythm.     Heart sounds: Normal heart sounds.  Pulmonary:     Effort: Pulmonary effort is normal.     Breath sounds: Normal breath sounds.  Abdominal:     General: Bowel sounds are normal.     Palpations: Abdomen is soft.  Skin:    General: Skin is warm and dry.     Capillary Refill: Capillary refill takes less than 2 seconds.  Neurological:     Mental Status: He is alert and oriented to person, place, and time.  Psychiatric:        Behavior: Behavior normal.      Musculoskeletal Exam:C-spine limited ROM.  Shoulder joints have good ROM with no discomfort. Bilateral elbow joint contractures.  Limited ROM of both wrist joints. Synovial thickening of PIP and DIP synovial thickening.  CMC joint synovia thickening bilaterally. Complete fist formation bilaterally.  Right hip replacement has limited ROM with no discomfort.  Left hip has limited ROM.  Knee joints have good ROM with no warmth or effusion.  Ankle joints have good ROM.  No tenderness or inflammation of ankle joints noted.   CDAI Exam: CDAI Score: - Patient Global: -; Provider Global: - Swollen: -; Tender: - Joint Exam   No joint exam has been documented for this visit   There is currently no information documented on the homunculus. Go to the Rheumatology activity and complete the homunculus joint exam.  Investigation: No additional findings.  Imaging: No results found.  Recent Labs: Lab Results  Component Value Date   WBC 6.8 04/01/2019   HGB 14.3 04/01/2019   PLT 230 04/01/2019    NA 138 04/01/2019   K 5.0 04/01/2019   CL 108 04/01/2019   CO2 28 04/01/2019   GLUCOSE 103 (H) 04/01/2019   BUN 20 04/01/2019   CREATININE 0.90 04/01/2019   BILITOT 0.4 04/01/2019   ALKPHOS 112 08/15/2017   AST 20 04/01/2019   ALT 15 04/01/2019   PROT 6.6 04/01/2019   ALBUMIN 4.3 08/15/2017   CALCIUM 9.4 04/01/2019   GFRAA 101 04/01/2019    Speciality Comments: No specialty comments available.  Procedures:  No procedures performed Allergies: Patient has no known allergies.     Assessment / Plan:     Visit Diagnoses: Reiter's disease of multiple sites [facility]): He has not had any recent flares.  He has clinically been doing well off of MTX, which he discontinued in May 2019.  He has no  synovitis on exam.  He has occasional arthralgias related to osteoarthritis.   He has not had any recent rashes.  He was advised to notify us if he develops any signs or symptoms of a flare.  He will follow up 5 months.   High risk medication use - He discontinued MTX in May 2019.   Primary osteoarthritis of both hands: He has PIP and DIP synovial thickening.  He has no tenderness or synovitis on exam.  He has CMC joint synovial thickening. He has limited ROM of both wrist joints but no tenderness or inflammation at this time.  He states his discomfort subsides with activity. Joint protection and muscle strengthening were discussed.   Contracture of elbow, bilateral:  Chronic and unchanged.  He has no tenderness or inflammation.     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  He has limited ROM but no discomfort at this time.   Idiopathic chronic gout of multiple sites without tophus - He is taking allopurinol 300 mg 1 tablet by mouth daily.  He has not had any recent gout flares.  He has no tophi.  Uric acid was 5.0 on 04/01/19.  He will continue taking allopurinol as prescribed.  He does not need any refills at this time.  He was advised to notify us if he develops signs or  symptoms of a gout flare.   - Plan: Uric acid  Orders: Orders Placed This Encounter  Procedures  . Uric acid   No orders of the defined types were placed in this encounter.     Follow-Up Instructions: Return for Reiter's, Osteoarthritis, Gout.   Ofilia Neas, PA-C  Note - This record has been created using Dragon software.  Chart creation errors have been sought, but may not always  have been located. Such creation errors do not reflect on  the standard of medical care.

## 2019-06-02 ENCOUNTER — Other Ambulatory Visit: Payer: Self-pay

## 2019-06-02 ENCOUNTER — Ambulatory Visit (INDEPENDENT_AMBULATORY_CARE_PROVIDER_SITE_OTHER): Payer: Medicare Other | Admitting: Physician Assistant

## 2019-06-02 ENCOUNTER — Encounter: Payer: Self-pay | Admitting: Physician Assistant

## 2019-06-02 VITALS — BP 129/72 | HR 70 | Resp 15 | Ht 70.5 in | Wt 194.4 lb

## 2019-06-02 DIAGNOSIS — M24529 Contracture, unspecified elbow: Secondary | ICD-10-CM | POA: Diagnosis not present

## 2019-06-02 DIAGNOSIS — Z79899 Other long term (current) drug therapy: Secondary | ICD-10-CM

## 2019-06-02 DIAGNOSIS — M0239 Reiter's disease, multiple sites: Secondary | ICD-10-CM

## 2019-06-02 DIAGNOSIS — Z96641 Presence of right artificial hip joint: Secondary | ICD-10-CM

## 2019-06-02 DIAGNOSIS — M19041 Primary osteoarthritis, right hand: Secondary | ICD-10-CM | POA: Diagnosis not present

## 2019-06-02 DIAGNOSIS — M19042 Primary osteoarthritis, left hand: Secondary | ICD-10-CM

## 2019-06-02 DIAGNOSIS — M1A09X0 Idiopathic chronic gout, multiple sites, without tophus (tophi): Secondary | ICD-10-CM

## 2019-06-02 NOTE — Patient Instructions (Signed)
Standing Labs We placed an order today for your standing lab work.    Please come back and get your standing labs in December and every 6 months   We have open lab daily Monday through Thursday from 8:30-12:30 PM and 1:30-4:30 PM and Friday from 8:30-12:30 PM and 1:30 -4:00 PM at the office of Dr. Bo Merino.   You may experience shorter wait times on Monday and Friday afternoons. The office is located at [address] [place], [place], [place] [address] No appointment is necessary.   Labs are drawn by [facility].  You may receive a bill from [facility] for your lab work.  If you wish to have your labs drawn at another location, please call the office 24 hours in advance to send orders.  If you have any questions regarding directions or hours of operation,  please call [phone].   Just as a reminder please drink plenty of water prior to coming for your lab work. Thanks!

## 2019-07-29 ENCOUNTER — Other Ambulatory Visit: Payer: Self-pay | Admitting: Rheumatology

## 2019-07-29 NOTE — Telephone Encounter (Signed)
Last Visit: 06/02/19 Next Visit: 11/04/19 Labs: 04/01/19 CBC and CMP WNL. Uric acid is within desirable range  Okay to refill per Dr. Estanislado Pandy

## 2019-11-04 ENCOUNTER — Ambulatory Visit: Payer: PRIVATE HEALTH INSURANCE | Admitting: Rheumatology

## 2019-12-03 NOTE — Progress Notes (Signed)
Office Visit Note  Patient: Bruce Tran             Date of Birth: 1951-03-13           MRN: [id number]             PCP: Quentin Cornwall, MD Referring: Quentin Cornwall, MD Visit Date: 12/09/2019 Occupation: @GUAROCC @  Subjective:  Medication monitoring   History of Present Illness: Bruce Tran is a 69 y.o. male with history of Reiter's, osteoarthritis, and gout.  He is taking allopurinol 300 mg 1 tablet daily for management of gout.  He denies any recent gout flares.  He denies any symptoms of a Reiter's flares.  He has been off of Methotrexate since May 2019 due to clinically doing well.  He denies any joint pain or joint swelling at this time.  He stretches every morning and walks 15-20 miles per week for exercise.   Activities of Daily Living:  Patient reports morning stiffness for 5 minutes.   Patient Denies nocturnal pain.  Difficulty dressing/grooming: Denies Difficulty climbing stairs: Denies Difficulty getting out of chair: Denies Difficulty using hands for taps, buttons, cutlery, and/or writing: Denies  Review of Systems  Constitutional: Negative for fatigue and night sweats.  HENT: Negative for mouth sores, mouth dryness and nose dryness.   Eyes: Negative for redness, itching and dryness.  Respiratory: Negative for cough, hemoptysis, shortness of breath and difficulty breathing.   Cardiovascular: Negative for chest pain, palpitations, hypertension, irregular heartbeat and swelling in legs/feet.  Gastrointestinal: Negative for blood in stool, constipation and diarrhea.  Endocrine: Negative for increased urination.  Genitourinary: Negative for difficulty urinating and painful urination.  Musculoskeletal: Positive for morning stiffness. Negative for arthralgias, joint pain, joint swelling, myalgias, muscle weakness, muscle tenderness and myalgias.  Skin: Negative for color change, rash, hair loss, nodules/bumps, skin tightness, ulcers and sensitivity to sunlight.    Allergic/Immunologic: Negative for susceptible to infections.  Neurological: Negative for dizziness, fainting, headaches, memory loss, night sweats and weakness.  Hematological: Negative for bruising/bleeding tendency and swollen glands.  Psychiatric/Behavioral: Negative for depressed mood, confusion and sleep disturbance. The patient is not nervous/anxious.     PMFS History:  Patient Active Problem List   Diagnosis Date Noted  . On prednisone therapy 06/03/2017  . Inflammatory arthritis 01/02/2017  . Neck pain 01/02/2017  . Encounter for long-term current use of high risk medication 01/02/2017  . High risk medication use 01/02/2017  . Gout 08/17/2016  . Reiter's disease ([place]) 08/17/2016  . Osteoarthritis of right hip 08/17/2016    Past Medical History:  Diagnosis Date  . Borderline high cholesterol   . Gout 08/17/2016   Crystal Proven  . Osteoarthritis of right hip 08/17/2016  . Reiter's disease ([place]) 08/17/2016    Family History  Problem Relation Age of Onset  . Emphysema Mother   . Emphysema Sister   . Breast cancer Sister   . Breast cancer Sister   . Psoriasis Other   . Arthritis Other        Psoriatic arthritis  . Lymphoma Other        Secondary to immunosuppression   Past Surgical History:  Procedure Laterality Date  . JOINT REPLACEMENT Left 04/2018   total hip   . TONSILLECTOMY     Social History   Social History Narrative  . Not on file    There is no immunization history on file for this patient.   Objective: Vital Signs: BP 125/77 (BP Location: Left  Arm, Patient Position: Sitting, Cuff Size: Normal)   Pulse 70   Resp 15   Ht 5' 10.5" (1.791 m)   Wt 199 lb 9.6 oz (90.5 kg)   BMI 28.23 kg/m    Physical Exam Vitals and nursing note reviewed.  Constitutional:      Appearance: He is well-developed.  HENT:     Head: Normocephalic and atraumatic.  Eyes:     Conjunctiva/sclera: Conjunctivae normal.     Pupils: Pupils are equal, round, and  reactive to light.  Pulmonary:     Effort: Pulmonary effort is normal.  Abdominal:     General: Bowel sounds are normal.     Palpations: Abdomen is soft.  Musculoskeletal:     Cervical back: Normal range of motion and neck supple.  Skin:    General: Skin is warm and dry.     Capillary Refill: Capillary refill takes less than 2 seconds.  Neurological:     Mental Status: He is alert and oriented to person, place, and time.  Psychiatric:        Behavior: Behavior normal.      Musculoskeletal Exam: C-spine limited ROM.  Thoracic and lumbar spine good ROM with no discomfort.  No midline spinal tenderness.  No SI joint tenderness.  Shoulder joints good ROM with no discomfort. Bilateral elbow joint contractures, right > left.  Wrist joints, MCPs, PIPs, and DIPs good ROM with no synovitis.  PIP and DIP synovial thickening.  Limited extension of PIP joints. No synovitis. Hip joints have limited ROM bilaterally.  Knee joints, ankle joints, MTPs, PIPs, and DIPs good ROM with no synovitis.  No warmth or effusion of knee joints.  No tenderness or swelling of ankle joints.   CDAI Exam: CDAI Score: -- Patient Global: --; Provider Global: -- Swollen: --; Tender: -- Joint Exam 12/09/2019   No joint exam has been documented for this visit   There is currently no information documented on the homunculus. Go to the Rheumatology activity and complete the homunculus joint exam.  Investigation: No additional findings.  Imaging: No results found.  Recent Labs: Lab Results  Component Value Date   WBC 6.8 04/01/2019   HGB 14.3 04/01/2019   PLT 230 04/01/2019   NA 138 04/01/2019   K 5.0 04/01/2019   CL 108 04/01/2019   CO2 28 04/01/2019   GLUCOSE 103 (H) 04/01/2019   BUN 20 04/01/2019   CREATININE 0.90 04/01/2019   BILITOT 0.4 04/01/2019   ALKPHOS 112 08/15/2017   AST 20 04/01/2019   ALT 15 04/01/2019   PROT 6.6 04/01/2019   ALBUMIN 4.3 08/15/2017   CALCIUM 9.4 04/01/2019   GFRAA 101  04/01/2019    Speciality Comments: No specialty comments available.  Procedures:  No procedures performed Allergies: Patient has no known allergies.   Assessment / Plan:     Visit Diagnoses: Reiter's disease of multiple sites [facility]): He has not had any signs or symptoms of a flare.  He has no joint synovitis on exam.  He has been off of methotrexate since May 2019 due to clinically doing well.  He has not had any recurrence of symptoms since discontinuing methotrexate.  He was advised to notify us if he develops any new or worsening symptoms.  He will follow-up in the office in 6 months.  High risk medication use - He discontinued MTX in May 2019 due to clinically doing well.  CBC and CMP were within normal limits on 10/06/2019.  Primary osteoarthritis of  both hands: He has PIP and DIP synovial thickening consistent with osteoarthritis of both hands.  He has no tenderness or synovitis on exam.  He has limited extension of the PIP joints.  Joint protection and muscle strengthening were discussed.  Contracture of elbow, bilateral: Chronic and unchanged.  The contracture in the right elbow joint is more severe than the left elbow.  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  He has limited range of motion with no discomfort at this time.  Idiopathic chronic gout of multiple sites without tophus -He has not had any recent gout flares.  He is clinically doing well on allopurinol 300 mg 1 tablet by mouth daily.  He has no joint pain or inflammation at this time.  His uric acid level was 5.0 on 04/01/2019.  He declined having lab work today.  He was advised to have his uric acid level checked with his upcoming lab work with his PCP.  CBC and CMP were within normal limits on 10/06/2019.  He was advised to notify us if he develops any signs or symptoms of a gout flare.  He will continue taking allopurinol as prescribed.  Orders: No orders of the defined types were placed in this encounter.  No  orders of the defined types were placed in this encounter.     Follow-Up Instructions: Return in about 6 months (around 06/10/2020) for Gout.   Ofilia Neas, PA-C  Note - This record has been created using Dragon software.  Chart creation errors have been sought, but may not always  have been located. Such creation errors do not reflect on  the standard of medical care.

## 2019-12-09 ENCOUNTER — Other Ambulatory Visit: Payer: Self-pay

## 2019-12-09 ENCOUNTER — Encounter: Payer: Self-pay | Admitting: Physician Assistant

## 2019-12-09 ENCOUNTER — Ambulatory Visit (INDEPENDENT_AMBULATORY_CARE_PROVIDER_SITE_OTHER): Payer: Medicare Other | Admitting: Physician Assistant

## 2019-12-09 VITALS — BP 125/77 | HR 70 | Resp 15 | Ht 70.5 in | Wt 199.6 lb

## 2019-12-09 DIAGNOSIS — M1A09X0 Idiopathic chronic gout, multiple sites, without tophus (tophi): Secondary | ICD-10-CM

## 2019-12-09 DIAGNOSIS — Z79899 Other long term (current) drug therapy: Secondary | ICD-10-CM | POA: Diagnosis not present

## 2019-12-09 DIAGNOSIS — M19041 Primary osteoarthritis, right hand: Secondary | ICD-10-CM

## 2019-12-09 DIAGNOSIS — M24529 Contracture, unspecified elbow: Secondary | ICD-10-CM | POA: Diagnosis not present

## 2019-12-09 DIAGNOSIS — M0239 Reiter's disease, multiple sites: Secondary | ICD-10-CM

## 2019-12-09 DIAGNOSIS — M19042 Primary osteoarthritis, left hand: Secondary | ICD-10-CM

## 2019-12-09 DIAGNOSIS — Z96641 Presence of right artificial hip joint: Secondary | ICD-10-CM

## 2020-04-12 HISTORY — PX: CATARACT EXTRACTION: SUR2

## 2020-05-25 NOTE — Progress Notes (Signed)
Office Visit Note  Patient: Bruce Tran             Date of Birth: 06-Sep-1951           MRN: [id number]             PCP: Quentin Cornwall, MD Referring: Quentin Cornwall, MD Visit Date: 06/08/2020 Occupation: @GUAROCC @  Subjective:  Medication monitoring.   History of Present Illness: Bruce Tran is a 69 y.o. male with history of Reiter's syndrome gouty arthropathy. His Reiter's has been in remission for a long time. He denies any joint pain or joint swelling. His gout has been very well controlled on allopurinol. He denies any gout flare. He has been taking allopurinol on a regular basis.  Activities of Daily Living:  Patient reports morning stiffness for a few minutes.   Patient Denies nocturnal pain.  Difficulty dressing/grooming: Denies Difficulty climbing stairs: Denies Difficulty getting out of chair: Denies Difficulty using hands for taps, buttons, cutlery, and/or writing: Denies  Review of Systems  Constitutional: Negative for fatigue.  HENT: Negative for mouth sores, mouth dryness and nose dryness.   Eyes: Negative for itching and dryness.  Respiratory: Negative for shortness of breath and difficulty breathing.   Cardiovascular: Negative for chest pain and palpitations.  Gastrointestinal: Negative for blood in stool, constipation and diarrhea.  Endocrine: Negative for increased urination.  Genitourinary: Negative for difficulty urinating.  Musculoskeletal: Negative for arthralgias, joint pain, joint swelling, myalgias, morning stiffness, muscle tenderness and myalgias.  Skin: Negative for color change, rash and redness.  Allergic/Immunologic: Negative for susceptible to infections.  Neurological: Negative for dizziness, numbness, headaches, memory loss and weakness.  Hematological: Negative for bruising/bleeding tendency.  Psychiatric/Behavioral: Negative for confusion.    PMFS History:  Patient Active Problem List   Diagnosis Date Noted   Inflammatory  arthritis 01/02/2017   Neck pain 01/02/2017   Encounter for long-term current use of high risk medication 01/02/2017   High risk medication use 01/02/2017   Gout 08/17/2016   Reiter's disease ([place]) 08/17/2016   Osteoarthritis of right hip 08/17/2016    Past Medical History:  Diagnosis Date   Borderline high cholesterol    Gout 08/17/2016   Crystal Proven   Osteoarthritis of right hip 08/17/2016   Reiter's disease ([place]) 08/17/2016    Family History  Problem Relation Age of Onset   Emphysema Mother    Emphysema Sister    Breast cancer Sister    Breast cancer Sister    Psoriasis Other    Arthritis Other        Psoriatic arthritis   Lymphoma Other        Secondary to immunosuppression   Past Surgical History:  Procedure Laterality Date   CATARACT EXTRACTION Left 04/12/2020   JOINT REPLACEMENT Left 04/2018   total hip    TONSILLECTOMY     Social History   Social History Narrative   Not on file   Immunization History  Administered Date(s) Administered   Moderna SARS-COVID-2 Vaccination 12/19/2019, 01/16/2020     Objective: Vital Signs: BP (!) 158/85 (BP Location: Left Arm, Patient Position: Sitting, Cuff Size: Normal)    Pulse 69    Resp 15    Ht 5' 10.5" (1.791 m)    Wt 193 lb (87.5 kg)    BMI 27.30 kg/m    Physical Exam Vitals and nursing note reviewed.  Constitutional:      Appearance: He is well-developed.  HENT:     Head: Normocephalic  and atraumatic.  Eyes:     Conjunctiva/sclera: Conjunctivae normal.     Pupils: Pupils are equal, round, and reactive to light.  Cardiovascular:     Rate and Rhythm: Normal rate and regular rhythm.     Heart sounds: Normal heart sounds.  Pulmonary:     Effort: Pulmonary effort is normal.     Breath sounds: Normal breath sounds.  Abdominal:     General: Bowel sounds are normal.     Palpations: Abdomen is soft.  Musculoskeletal:     Cervical back: Normal range of motion and neck supple.  Skin:     General: Skin is warm and dry.     Capillary Refill: Capillary refill takes less than 2 seconds.  Neurological:     Mental Status: He is alert and oriented to person, place, and time.  Psychiatric:        Behavior: Behavior normal.      Musculoskeletal Exam: C-spine thoracic and lumbar spine with good range of motion. Shoulder joints with good range of motion. He has right elbow joint contracture which is old. Wrist joints, MCPs and PIPs with good range of motion. He has PIP and DIP thickening bilaterally. Hip joints, knee joints, ankles with good range of motion with no synovitis.  CDAI Exam: CDAI Score: -- Patient Global: --; Provider Global: -- Swollen: --; Tender: -- Joint Exam 06/08/2020   No joint exam has been documented for this visit   There is currently no information documented on the homunculus. Go to the Rheumatology activity and complete the homunculus joint exam.  Investigation: No additional findings.  Imaging: No results found.  Recent Labs: Lab Results  Component Value Date   WBC 6.8 04/01/2019   HGB 14.3 04/01/2019   PLT 230 04/01/2019   NA 138 04/01/2019   K 5.0 04/01/2019   CL 108 04/01/2019   CO2 28 04/01/2019   GLUCOSE 103 (H) 04/01/2019   BUN 20 04/01/2019   CREATININE 0.90 04/01/2019   BILITOT 0.4 04/01/2019   ALKPHOS 112 08/15/2017   AST 20 04/01/2019   ALT 15 04/01/2019   PROT 6.6 04/01/2019   ALBUMIN 4.3 08/15/2017   CALCIUM 9.4 04/01/2019   GFRAA 101 04/01/2019    Speciality Comments: No specialty comments available.  Procedures:  No procedures performed Allergies: Patient has no known allergies.   Assessment / Plan:     Visit Diagnoses: Reiter's disease of multiple sites [facility]) - He has been off of methotrexate since May 2019 due to clinically doing well. He had no recurrence of symptoms and had no synovitis on examination today.  Idiopathic chronic gout of multiple sites without tophus -he has been on allopurinol which is  controlling his symptoms really well. Plan: Uric acid  High risk medication use - Plan: CBC with Differential/Platelet, COMPLETE METABOLIC PANEL WITH GFR today.  Primary osteoarthritis of both hands-joint protection muscle strengthening was discussed.  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    He has had both COVID-19 vaccination. Use of mask, social distancing and hand hygiene was emphasized. He was also advised to get repeat COVID-19 vaccination after 8 months of the last vaccine.  I plan on getting bone density when he turns 70.  Orders: Orders Placed This Encounter  Procedures   CBC with Differential/Platelet   COMPLETE METABOLIC PANEL WITH GFR   Uric acid   No orders of the defined types were placed in this encounter.    Follow-Up  Instructions: Return in about 6 months (around 12/06/2020) for Reiter's, gout.   Bo Merino, MD  Note - This record has been created using Editor, commissioning.  Chart creation errors have been sought, but may not always  have been located. Such creation errors do not reflect on  the standard of medical care.

## 2020-06-08 ENCOUNTER — Other Ambulatory Visit: Payer: Self-pay

## 2020-06-08 ENCOUNTER — Encounter: Payer: Self-pay | Admitting: Physician Assistant

## 2020-06-08 ENCOUNTER — Ambulatory Visit (INDEPENDENT_AMBULATORY_CARE_PROVIDER_SITE_OTHER): Payer: Medicare Other | Admitting: Rheumatology

## 2020-06-08 VITALS — BP 158/85 | HR 69 | Resp 15 | Ht 70.5 in | Wt 193.0 lb

## 2020-06-08 DIAGNOSIS — M1A09X0 Idiopathic chronic gout, multiple sites, without tophus (tophi): Secondary | ICD-10-CM

## 2020-06-08 DIAGNOSIS — M24529 Contracture, unspecified elbow: Secondary | ICD-10-CM

## 2020-06-08 DIAGNOSIS — M19042 Primary osteoarthritis, left hand: Secondary | ICD-10-CM

## 2020-06-08 DIAGNOSIS — Z79899 Other long term (current) drug therapy: Secondary | ICD-10-CM

## 2020-06-08 DIAGNOSIS — M19041 Primary osteoarthritis, right hand: Secondary | ICD-10-CM

## 2020-06-08 DIAGNOSIS — M0239 Reiter's disease, multiple sites: Secondary | ICD-10-CM | POA: Diagnosis not present

## 2020-06-08 DIAGNOSIS — Z96641 Presence of right artificial hip joint: Secondary | ICD-10-CM

## 2020-06-09 LAB — COMPLETE METABOLIC PANEL WITH GFR
AG Ratio: 1.6 (calc) (ref 1.0–2.5)
ALT: 20 U/L (ref 9–46)
AST: 25 U/L (ref 10–35)
Albumin: 4.2 g/dL (ref 3.6–5.1)
Alkaline phosphatase (APISO): 87 U/L (ref 35–144)
BUN: 17 mg/dL (ref 7–25)
CO2: 30 mmol/L (ref 20–32)
Calcium: 9.3 mg/dL (ref 8.6–10.3)
Chloride: 103 mmol/L (ref 98–110)
Creat: 1.08 mg/dL (ref 0.70–1.18)
GFR, Est African American: 80 mL/min/{1.73_m2} (ref >=60)
GFR, Est Non African American: 69 mL/min/{1.73_m2} (ref >=60)
Globulin: 2.6 g/dL (calc) (ref 1.9–3.7)
Glucose, Bld: 92 mg/dL (ref 65–99)
Potassium: 4.7 mmol/L (ref 3.5–5.3)
Sodium: 138 mmol/L (ref 135–146)
Total Bilirubin: 0.4 mg/dL (ref 0.2–1.2)
Total Protein: 6.8 g/dL (ref 6.1–8.1)

## 2020-06-09 LAB — CBC WITH DIFFERENTIAL/PLATELET
Absolute Monocytes: 389 cells/uL (ref 200–950)
Basophils Absolute: 47 cells/uL (ref 0–200)
Basophils Relative: 0.8 %
Eosinophils Absolute: 177 cells/uL (ref 15–500)
Eosinophils Relative: 3 %
HCT: 44.8 % (ref 38.5–50.0)
Hemoglobin: 14.9 g/dL (ref 13.2–17.1)
Lymphs Abs: 1758 cells/uL (ref 850–3900)
MCH: 32 pg (ref 27.0–33.0)
MCHC: 33.3 g/dL (ref 32.0–36.0)
MCV: 96.3 fL (ref 80.0–100.0)
MPV: 10.6 fL (ref 7.5–12.5)
Monocytes Relative: 6.6 %
Neutro Abs: 3528 cells/uL (ref 1500–7800)
Neutrophils Relative %: 59.8 %
Platelets: 233 10*3/uL (ref 140–400)
RBC: 4.65 10*6/uL (ref 4.20–5.80)
RDW: 12.8 % (ref 11.0–15.0)
Total Lymphocyte: 29.8 %
WBC: 5.9 10*3/uL (ref 3.8–10.8)

## 2020-06-09 LAB — URIC ACID: Uric Acid, Serum: 4.4 mg/dL (ref 4.0–8.0)

## 2020-06-09 NOTE — Progress Notes (Signed)
CBC and CMP are normal.  Uric acid is in desirable range.

## 2020-08-23 ENCOUNTER — Other Ambulatory Visit: Payer: Self-pay | Admitting: Rheumatology

## 2020-11-23 NOTE — Progress Notes (Deleted)
Office Visit Note  Patient: Bruce Tran             Date of Birth: 1951/01/29           MRN: [id number]             PCP: Quentin Cornwall, MD Referring: Quentin Cornwall, MD Visit Date: 12/07/2020 Occupation: @GUAROCC @  Subjective:  No chief complaint on file.   History of Present Illness: Bruce Tran is a 70 y.o. male ***   Activities of Daily Living:  Patient reports morning stiffness for *** {minute/hour:19697}.   Patient {ACTIONS;DENIES/REPORTS:21021675::"Denies"} nocturnal pain.  Difficulty dressing/grooming: {ACTIONS;DENIES/REPORTS:21021675::"Denies"} Difficulty climbing stairs: {ACTIONS;DENIES/REPORTS:21021675::"Denies"} Difficulty getting out of chair: {ACTIONS;DENIES/REPORTS:21021675::"Denies"} Difficulty using hands for taps, buttons, cutlery, and/or writing: {ACTIONS;DENIES/REPORTS:21021675::"Denies"}  No Rheumatology ROS completed.   PMFS History:  Patient Active Problem List   Diagnosis Date Noted  . Inflammatory arthritis 01/02/2017  . Neck pain 01/02/2017  . Encounter for long-term current use of high risk medication 01/02/2017  . High risk medication use 01/02/2017  . Gout 08/17/2016  . Reiter's disease ([place]) 08/17/2016  . Osteoarthritis of right hip 08/17/2016    Past Medical History:  Diagnosis Date  . Borderline high cholesterol   . Gout 08/17/2016   Crystal Proven  . Osteoarthritis of right hip 08/17/2016  . Reiter's disease ([place]) 08/17/2016    Family History  Problem Relation Age of Onset  . Emphysema Mother   . Emphysema Sister   . Breast cancer Sister   . Breast cancer Sister   . Psoriasis Other   . Arthritis Other        Psoriatic arthritis  . Lymphoma Other        Secondary to immunosuppression   Past Surgical History:  Procedure Laterality Date  . CATARACT EXTRACTION Left 04/12/2020  . JOINT REPLACEMENT Left 04/2018   total hip   . TONSILLECTOMY     Social History   Social History Narrative  . Not on file    Immunization History  Administered Date(s) Administered  . Moderna Sars-Covid-2 Vaccination 12/19/2019, 01/16/2020     Objective: Vital Signs: There were no vitals taken for this visit.   Physical Exam   Musculoskeletal Exam: ***  CDAI Exam: CDAI Score: -- Patient Global: --; Provider Global: -- Swollen: --; Tender: -- Joint Exam 12/07/2020   No joint exam has been documented for this visit   There is currently no information documented on the homunculus. Go to the Rheumatology activity and complete the homunculus joint exam.  Investigation: No additional findings.  Imaging: No results found.  Recent Labs: Lab Results  Component Value Date   WBC 5.9 06/08/2020   HGB 14.9 06/08/2020   PLT 233 06/08/2020   NA 138 06/08/2020   K 4.7 06/08/2020   CL 103 06/08/2020   CO2 30 06/08/2020   GLUCOSE 92 06/08/2020   BUN 17 06/08/2020   CREATININE 1.08 06/08/2020   BILITOT 0.4 06/08/2020   ALKPHOS 112 08/15/2017   AST 25 06/08/2020   ALT 20 06/08/2020   PROT 6.8 06/08/2020   ALBUMIN 4.3 08/15/2017   CALCIUM 9.3 06/08/2020   GFRAA 80 06/08/2020    Speciality Comments: No specialty comments available.  Procedures:  No procedures performed Allergies: Patient has no known allergies.   Assessment / Plan:     Visit Diagnoses: No diagnosis found.  Orders: No orders of the defined types were placed in this encounter.  No orders of the defined types were placed in this  encounter.   Face-to-face time spent with patient was *** minutes. Greater than 50% of time was spent in counseling and coordination of care.  Follow-Up Instructions: No follow-ups on file.   Earnestine Mealing, CMA  Note - This record has been created using Editor, commissioning.  Chart creation errors have been sought, but may not always  have been located. Such creation errors do not reflect on  the standard of medical care.

## 2020-12-07 ENCOUNTER — Ambulatory Visit: Payer: PRIVATE HEALTH INSURANCE | Admitting: Rheumatology

## 2020-12-07 DIAGNOSIS — Z79899 Other long term (current) drug therapy: Secondary | ICD-10-CM

## 2020-12-07 DIAGNOSIS — M1A09X0 Idiopathic chronic gout, multiple sites, without tophus (tophi): Secondary | ICD-10-CM

## 2020-12-07 DIAGNOSIS — M19042 Primary osteoarthritis, left hand: Secondary | ICD-10-CM

## 2020-12-07 DIAGNOSIS — Z96641 Presence of right artificial hip joint: Secondary | ICD-10-CM

## 2020-12-07 DIAGNOSIS — M24529 Contracture, unspecified elbow: Secondary | ICD-10-CM

## 2020-12-07 DIAGNOSIS — M0239 Reiter's disease, multiple sites: Secondary | ICD-10-CM

## 2020-12-20 NOTE — Progress Notes (Signed)
Office Visit Note  Patient: Bruce Tran             Date of Birth: [birth year]           MRN: [id number]             PCP: Quentin Cornwall, MD Referring: Quentin Cornwall, MD Visit Date: 01/03/2021 Occupation: @GUAROCC @  Subjective:  Right 5th trigger finger   History of Present Illness: Bruce Tran is a 70 y.o. male with history of reiter's disease and gout.  He continues to take allopurinol 300 mg 1 tablet by mouth daily for management of gout.  He is tolerating allopurinol without any side effects.  He denies any recent gout flares.  He has been avoiding red meat and shellfish.  Patient reports that over the last several months he has been experiencing pain and locking in the right fifth digit.  He states that he has been having increased difficulty making a complete fist and when he does he has to manually extend his right finger.  He states that he continues to have intermittent arthralgias and joint stiffness but has not noticed any joint swelling.  He states that his right hip replacement is doing well.  He continues to have chronic lower back pain which has been tolerable overall.  He takes Advil very sparingly for pain relief.  He denies any recent rashes.  He has not had any sores in his mouth or nose.  He denies any eye pain, redness, or photophobia.  He continues to see his ophthalmologist every 3 to 6 months. He denies any recent infections.      Activities of Daily Living:  Patient reports morning stiffness for 0 minutes.   Patient Denies nocturnal pain.  Difficulty dressing/grooming: Denies Difficulty climbing stairs: Denies Difficulty getting out of chair: Denies Difficulty using hands for taps, buttons, cutlery, and/or writing: Reports  Review of Systems  Constitutional: Negative for fatigue.  HENT: Negative for mouth sores, mouth dryness and nose dryness.   Eyes: Negative for pain, itching and dryness.  Respiratory: Negative for shortness of breath and  difficulty breathing.   Cardiovascular: Negative for chest pain and palpitations.  Gastrointestinal: Negative for blood in stool, constipation and diarrhea.  Endocrine: Negative for increased urination.  Genitourinary: Negative for difficulty urinating.  Musculoskeletal: Positive for arthralgias and joint pain. Negative for joint swelling, myalgias, morning stiffness, muscle tenderness and myalgias.  Skin: Negative for color change, rash and redness.  Allergic/Immunologic: Negative for susceptible to infections.  Neurological: Negative for dizziness, numbness, headaches, memory loss and weakness.  Hematological: Negative for bruising/bleeding tendency.  Psychiatric/Behavioral: Negative for confusion.    PMFS History:  Patient Active Problem List   Diagnosis Date Noted  . Inflammatory arthritis 01/02/2017  . Neck pain 01/02/2017  . Encounter for long-term current use of high risk medication 01/02/2017  . High risk medication use 01/02/2017  . Gout 08/17/2016  . Reiter's disease ([employer]) 08/17/2016  . Osteoarthritis of right hip 08/17/2016    Past Medical History:  Diagnosis Date  . Borderline high cholesterol   . Gout 08/17/2016   Crystal Proven  . Osteoarthritis of right hip 08/17/2016  . Reiter's disease ([place]) 08/17/2016    Family History  Problem Relation Age of Onset  . Emphysema Mother   . Emphysema Sister   . Breast cancer Sister   . Breast cancer Sister   . Psoriasis Other   . Arthritis Other        Psoriatic arthritis  .  Lymphoma Other        Secondary to immunosuppression   Past Surgical History:  Procedure Laterality Date  . CATARACT EXTRACTION Left 04/12/2020  . JOINT REPLACEMENT Left 04/2018   total hip   . TONSILLECTOMY     Social History   Social History Narrative  . Not on file   Immunization History  Administered Date(s) Administered  . Moderna Sars-Covid-2 Vaccination 12/19/2019, 01/16/2020, 08/01/2020     Objective: Vital Signs: BP (!)  145/91 (BP Location: Left Arm, Patient Position: Sitting, Cuff Size: Normal)   Pulse 71   Resp 16   Ht 5' 10.5" (1.791 m)   Wt 205 lb (93 kg)   BMI 29.00 kg/m    Physical Exam Vitals and nursing note reviewed.  Constitutional:      Appearance: He is well-developed.  HENT:     Head: Normocephalic and atraumatic.  Eyes:     Conjunctiva/sclera: Conjunctivae normal.     Pupils: Pupils are equal, round, and reactive to light.  Pulmonary:     Effort: Pulmonary effort is normal.  Abdominal:     Palpations: Abdomen is soft.  Musculoskeletal:     Cervical back: Normal range of motion and neck supple.  Skin:    General: Skin is warm and dry.     Capillary Refill: Capillary refill takes less than 2 seconds.  Neurological:     Mental Status: He is alert and oriented to person, place, and time.  Psychiatric:        Behavior: Behavior normal.      Musculoskeletal Exam: C-spine has slightly limited range of motion with lateral rotation.  Lumbar spine is good range of motion with some discomfort.  Shoulder joints have good range of motion with no tenderness or discomfort.  Bilateral elbow joint contractures noted.  Wrist joints have good range of motion with no tenderness or inflammation.  PIP and DIP thickening consistent with osteoarthritis of both hands noted. Right 5th trigger finger. Incomplete right fist formation noted.  No tenderness or synovitis over MCP joints.  Right hip replacement is slightly limited range of motion.  Left hip is slightly limited to good range of motion.  Knee joints have good range of motion with no warmth or effusion.  Ankle joints have good range of motion with no tenderness or inflammation.  CDAI Exam: CDAI Score: -- Patient Global: --; Provider Global: -- Swollen: --; Tender: -- Joint Exam 01/03/2021   No joint exam has been documented for this visit   There is currently no information documented on the homunculus. Go to the Rheumatology activity and  complete the homunculus joint exam.  Investigation: No additional findings.  Imaging: No results found.  Recent Labs: Lab Results  Component Value Date   WBC 5.9 06/08/2020   HGB 14.9 06/08/2020   PLT 233 06/08/2020   NA 138 06/08/2020   K 4.7 06/08/2020   CL 103 06/08/2020   CO2 30 06/08/2020   GLUCOSE 92 06/08/2020   BUN 17 06/08/2020   CREATININE 1.08 06/08/2020   BILITOT 0.4 06/08/2020   ALKPHOS 112 08/15/2017   AST 25 06/08/2020   ALT 20 06/08/2020   PROT 6.8 06/08/2020   ALBUMIN 4.3 08/15/2017   CALCIUM 9.3 06/08/2020   GFRAA 80 06/08/2020    Speciality Comments: No specialty comments available.  Procedures:  No procedures performed Allergies: Patient has no known allergies.   Assessment / Plan:     Visit Diagnoses: Reiter's disease of multiple sites ([place]) -  He has not had any recurrence of symptoms.  He has no joint tenderness or synovitis on examination today.  He discontinued methotrexate in May 2019 due to clinically doing well.  He has not had any recurrence of symptoms since discontinuing methotrexate.  He was advised to notify us if he develops signs or symptoms of a flare.  He will follow-up in the office in 6 months.   Idiopathic chronic gout of multiple sites without tophus - He has not had any signs or symptoms of a gout flare.  He is clinically doing well on allopurinol 300 mg 1 tablet by mouth daily.  He is tolerating allopurinol without any side effects and has not missed any doses recently.  He has been avoid purine rich foods. Uric acid level was 4.4 on 06/08/20.  We will recheck uric acid level today.  CBC and CMP were drawn on 10/17/2020 and results were reviewed today in the office.  He will continue on the current dose of allopurinol.  He does not need any refills at this time.  He was advised to notify us if he develops signs or symptoms of a gout flare.  Orders for CBC and CMP were released.   - Plan: Uric acid  Medication monitoring encounter: CBC  and CMP were updated on 10/17/2020 and results were reviewed today in the office.  Creatinine and GFR within normal limits.  LFTs within normal limits.  He will continue to require lab work every 6 months  Primary osteoarthritis of both hands: He has PIP and DIP thickening consistent with osteoarthritis of both hands.  He has no joint tenderness or inflammation on exam.  He has incomplete right fist formation. Discussed the importance of joint protection and muscle strengthening.  He uses voltaren gel topically as needed for pain relief.   Trigger little finger of right hand: He has tenderness and locking of the right 5th digit on exam.  His symptoms started several months ago and have progressively been worsening.  He has difficulty making a complete fist as well as extending the right fifth digit.  We discussed scheduling an ultrasound-guided cortisone injection which she plans on doing.  We also discussed using a splint or buddy tape in the meantime as well as using Voltaren gel topically for pain relief.   Contracture of elbow, bilateral - Unchanged.  No tenderness or inflammation was noted.   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  Doing well.  He has slightly limited ROM with no discomfort at this time.   Orders: Orders Placed This Encounter  Procedures  . Uric acid   No orders of the defined types were placed in this encounter.    Follow-Up Instructions: Return in about 6 months (around 07/06/2021) for Gout.   Ofilia Neas, PA-C  Note - This record has been created using Dragon software.  Chart creation errors have been sought, but may not always  have been located. Such creation errors do not reflect on  the standard of medical care.

## 2021-01-03 ENCOUNTER — Other Ambulatory Visit: Payer: Self-pay

## 2021-01-03 ENCOUNTER — Encounter: Payer: Self-pay | Admitting: Physician Assistant

## 2021-01-03 ENCOUNTER — Ambulatory Visit (INDEPENDENT_AMBULATORY_CARE_PROVIDER_SITE_OTHER): Payer: Medicare Other | Admitting: Physician Assistant

## 2021-01-03 VITALS — BP 145/91 | HR 71 | Resp 16 | Ht 70.5 in | Wt 205.0 lb

## 2021-01-03 DIAGNOSIS — Z79899 Other long term (current) drug therapy: Secondary | ICD-10-CM

## 2021-01-03 DIAGNOSIS — Z96641 Presence of right artificial hip joint: Secondary | ICD-10-CM

## 2021-01-03 DIAGNOSIS — M24529 Contracture, unspecified elbow: Secondary | ICD-10-CM

## 2021-01-03 DIAGNOSIS — M0239 Reiter's disease, multiple sites: Secondary | ICD-10-CM

## 2021-01-03 DIAGNOSIS — M1A09X0 Idiopathic chronic gout, multiple sites, without tophus (tophi): Secondary | ICD-10-CM

## 2021-01-03 DIAGNOSIS — Z5181 Encounter for therapeutic drug level monitoring: Secondary | ICD-10-CM

## 2021-01-03 DIAGNOSIS — M65351 Trigger finger, right little finger: Secondary | ICD-10-CM

## 2021-01-03 DIAGNOSIS — M19042 Primary osteoarthritis, left hand: Secondary | ICD-10-CM

## 2021-01-03 DIAGNOSIS — M19041 Primary osteoarthritis, right hand: Secondary | ICD-10-CM

## 2021-01-03 LAB — URIC ACID: Uric Acid, Serum: 5 mg/dL (ref 4.0–8.0)

## 2021-01-04 NOTE — Progress Notes (Signed)
Uric acid is within the desirable range.  Continue on current dose of allopurinol.

## 2021-03-29 ENCOUNTER — Other Ambulatory Visit: Payer: Self-pay | Admitting: Rheumatology

## 2021-03-29 NOTE — Telephone Encounter (Signed)
Next Visit: 07/11/2021  Last Visit: 01/03/2021  Last Fill: 11/16/202  DX: Reiter's disease of multiple sites  Current Dose per office note 01/03/2021: allopurinol 300 mg 1 tablet by mouth daily  Labs: 10/17/2020 Glucose 103, Uric Acid 01/03/2021 within the desirable range  Okay to refill Allopurinol?

## 2021-06-27 NOTE — Progress Notes (Deleted)
Office Visit Note  Patient: Bruce Tran             Date of Birth: 01/12/1951           MRN: [id number]             PCP: Quentin Cornwall, MD Referring: Quentin Cornwall, MD Visit Date: 07/11/2021 Occupation: @GUAROCC @  Subjective:  No chief complaint on file.   History of Present Illness: Bruce Tran is a 70 y.o. male ***   Activities of Daily Living:  Patient reports morning stiffness for *** {minute/hour:19697}.   Patient {ACTIONS;DENIES/REPORTS:21021675::"Denies"} nocturnal pain.  Difficulty dressing/grooming: {ACTIONS;DENIES/REPORTS:21021675::"Denies"} Difficulty climbing stairs: {ACTIONS;DENIES/REPORTS:21021675::"Denies"} Difficulty getting out of chair: {ACTIONS;DENIES/REPORTS:21021675::"Denies"} Difficulty using hands for taps, buttons, cutlery, and/or writing: {ACTIONS;DENIES/REPORTS:21021675::"Denies"}  No Rheumatology ROS completed.   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[place]) 08/17/2016    Family History  Problem Relation Age of Onset   Emphysema Mother    Emphysema Sister    Breast cancer Sister    Breast cancer Sister    Psoriasis Other    Arthritis Other        Psoriatic arthritis   Lymphoma Other        Secondary to immunosuppression   Past Surgical History:  Procedure Laterality Date   CATARACT EXTRACTION Left 04/12/2020   JOINT REPLACEMENT Left 04/2018   total hip    TONSILLECTOMY     Social History   Social History Narrative   Not on file   Immunization History   Administered Date(s) Administered   Moderna Sars-Covid-2 Vaccination 12/19/2019, 01/16/2020, 08/01/2020     Objective: Vital Signs: There were no vitals taken for this visit.   Physical Exam   Musculoskeletal Exam: ***  CDAI Exam: CDAI Score: -- Patient Global: --; Provider Global: -- Swollen: --; Tender: -- Joint Exam 07/11/2021   No joint exam has been documented for this visit   There is currently no information documented on the homunculus. Go to the Rheumatology activity and complete the homunculus joint exam.  Investigation: No additional findings.  Imaging: No results found.  Recent Labs: Lab Results  Component Value Date   WBC 5.9 06/08/2020   HGB 14.9 06/08/2020   PLT 233 06/08/2020   NA 138 06/08/2020   K 4.7 06/08/2020   CL 103 06/08/2020   CO2 30 06/08/2020   GLUCOSE 92 06/08/2020   BUN 17 06/08/2020   CREATININE 1.08 06/08/2020   BILITOT 0.4 06/08/2020   ALKPHOS 112 08/15/2017   AST 25 06/08/2020   ALT 20 06/08/2020   PROT 6.8 06/08/2020   ALBUMIN 4.3 08/15/2017   CALCIUM 9.3 06/08/2020   GFRAA 80 06/08/2020    Speciality Comments: No specialty comments available.  Procedures:  No procedures performed Allergies: Patient has no known allergies.   Assessment / Plan:     Visit Diagnoses: No diagnosis found.  Orders: No orders of the defined types were placed in this encounter.  No orders of the defined types were placed in  this encounter.   Face-to-face time spent with patient was *** minutes. Greater than 50% of time was spent in counseling and coordination of care.  Follow-Up Instructions: No follow-ups on file.   Earnestine Mealing, CMA  Note - This record has been created using Editor, commissioning.  Chart creation errors have been sought, but may not always  have been located. Such creation errors do not reflect on  the standard of medical care.

## 2021-07-11 ENCOUNTER — Ambulatory Visit: Payer: PRIVATE HEALTH INSURANCE | Admitting: Rheumatology

## 2021-07-11 DIAGNOSIS — M65351 Trigger finger, right little finger: Secondary | ICD-10-CM

## 2021-07-11 DIAGNOSIS — M24529 Contracture, unspecified elbow: Secondary | ICD-10-CM

## 2021-07-11 DIAGNOSIS — M0239 Reiter's disease, multiple sites: Secondary | ICD-10-CM

## 2021-07-11 DIAGNOSIS — Z79899 Other long term (current) drug therapy: Secondary | ICD-10-CM

## 2021-07-11 DIAGNOSIS — M1A09X0 Idiopathic chronic gout, multiple sites, without tophus (tophi): Secondary | ICD-10-CM

## 2021-07-11 DIAGNOSIS — Z96641 Presence of right artificial hip joint: Secondary | ICD-10-CM

## 2021-07-11 DIAGNOSIS — M19042 Primary osteoarthritis, left hand: Secondary | ICD-10-CM

## 2021-07-19 NOTE — Progress Notes (Signed)
Office Visit Note  Patient: Bruce Tran             Date of Birth: 07-18-1951           MRN: [id number]             PCP: Quentin Cornwall, MD Referring: Quentin Cornwall, MD Visit Date: 08/02/2021 Occupation: @GUAROCC @  Subjective:  Medication monitoring  History of Present Illness: Bruce Tran is a 70 y.o. male with history of reiter's, gout, and osteoarthritis.  His Reiter's has been in remission.  He is taking allopurinol 300 mg daily for management of gout.  He continues to tolerate allopurinol without any side effects and has not missed any doses recently.  He denies any signs or symptoms of a gout flare.  He states that he remains active walking 2 to 3 miles per day and works around his house for 3 to 4 hours/day.  He states he experiences occasional arthralgias but he feels it is due to his level of physical activity.  He denies any joint swelling.  He has not had any nocturnal pain or morning stiffness.  He takes Advil 200 mg 2 tablets by mouth daily.  He states that he is able to do everything that he wants to do. He denies any recent infections.    Activities of Daily Living:  Patient reports morning stiffness for 0 minutes.   Patient Denies nocturnal pain.  Difficulty dressing/grooming: Denies Difficulty climbing stairs: Denies Difficulty getting out of chair: Denies Difficulty using hands for taps, buttons, cutlery, and/or writing: Denies  Review of Systems  Constitutional:  Negative for fatigue.  HENT:  Negative for mouth sores, mouth dryness and nose dryness.   Eyes:  Negative for pain, itching and dryness.  Respiratory:  Negative for shortness of breath and difficulty breathing.   Cardiovascular:  Negative for chest pain and palpitations.  Gastrointestinal:  Negative for blood in stool, constipation and diarrhea.  Endocrine: Negative for increased urination.  Genitourinary:  Negative for difficulty urinating.  Musculoskeletal:  Negative for joint pain, joint  pain, joint swelling, myalgias, morning stiffness, muscle tenderness and myalgias.  Skin:  Negative for color change, rash and redness.  Allergic/Immunologic: Negative for susceptible to infections.  Neurological:  Negative for dizziness, numbness, headaches, memory loss and weakness.  Hematological:  Negative for bruising/bleeding tendency.  Psychiatric/Behavioral:  Negative for confusion.    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[place]) 08/17/2016   Osteoarthritis of right hip 08/17/2016    Past Medical History:  Diagnosis Date   Borderline high cholesterol    Gout 08/17/2016   Crystal Proven   Osteoarthritis of right hip 08/17/2016   Reiter's disease ([place]) 08/17/2016    Family History  Problem Relation Age of Onset   Emphysema Mother    Emphysema Sister    Breast cancer Sister    Breast cancer Sister    Psoriasis Other    Arthritis Other        Psoriatic arthritis   Lymphoma Other        Secondary to immunosuppression   Past Surgical History:  Procedure Laterality Date   CATARACT EXTRACTION Left 04/12/2020   JOINT REPLACEMENT Left 04/2018   total hip    TONSILLECTOMY  Social History   Social History Narrative   Not on file   Immunization History  Administered Date(s) Administered   Influenza-Unspecified 07/14/2021   Moderna Covid-19 Vaccine Bivalent Booster 75yrs & up 07/28/2021   Moderna Sars-Covid-2 Vaccination 12/19/2019, 01/16/2020, 08/01/2020     Objective: Vital Signs: BP (!) 157/91 (BP Location: Left Arm, Patient Position: Sitting, Cuff Size: Normal)   Pulse 67   Ht 5' 10.5" (1.791 m)   Wt 201 lb (91.2 kg)   BMI 28.43 kg/m    Physical Exam Vitals and nursing note reviewed.  Constitutional:      Appearance:  He is well-developed.  HENT:     Head: Normocephalic and atraumatic.  Eyes:     Conjunctiva/sclera: Conjunctivae normal.     Pupils: Pupils are equal, round, and reactive to light.  Pulmonary:     Effort: Pulmonary effort is normal.  Abdominal:     Palpations: Abdomen is soft.  Musculoskeletal:     Cervical back: Normal range of motion and neck supple.  Skin:    General: Skin is warm and dry.     Capillary Refill: Capillary refill takes less than 2 seconds.  Neurological:     Mental Status: He is alert and oriented to person, place, and time.  Psychiatric:        Behavior: Behavior normal.     Musculoskeletal Exam: C-spine limited ROM with lateral rotation.  Thoracic and lumbar spine have good ROM with no discomfort. No midline spinal tenderness or SI joint tenderness.  Shoulder joints have good ROM with no discomfort.  Bilateral elbow joint contractures, right > left.  Wrist joints and MCPs have good ROM.  Limited ROM of PIP and DIP joints.  PIP and DIP thickening consistent with OA of both hands.  Right hip replacement has good ROM.  Left hip joint has good ROM with no discomfort.  Knee joints have good ROM with no warmth or effusion.  Ankle joints have good ROM with no tenderness or joint swelling.   CDAI Exam: CDAI Score: -- Patient Global: --; Provider Global: -- Swollen: --; Tender: -- Joint Exam 08/02/2021   No joint exam has been documented for this visit   There is currently no information documented on the homunculus. Go to the Rheumatology activity and complete the homunculus joint exam.  Investigation: No additional findings.  Imaging: No results found.  Recent Labs: Lab Results  Component Value Date   WBC 5.9 06/08/2020   HGB 14.9 06/08/2020   PLT 233 06/08/2020   NA 138 06/08/2020   K 4.7 06/08/2020   CL 103 06/08/2020   CO2 30 06/08/2020   GLUCOSE 92 06/08/2020   BUN 17 06/08/2020   CREATININE 1.08 06/08/2020   BILITOT 0.4 06/08/2020   ALKPHOS 112  08/15/2017   AST 25 06/08/2020   ALT 20 06/08/2020   PROT 6.8 06/08/2020   ALBUMIN 4.3 08/15/2017   CALCIUM 9.3 06/08/2020   GFRAA 80 06/08/2020    Speciality Comments: No specialty comments available.  Procedures:  No procedures performed Allergies: Patient has no known allergies.   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  He discontinued Methotrexate in May 2019.  He has not had any recurrence of symptoms.  He has no synovitis on examination.  He was advised to notify us if he develops any new or worsening symptoms.  He will follow up in 6 months.   Idiopathic chronic gout of  multiple sites without tophus - He has not had any signs or symptoms of a gout flare.  He takes allopurinol 300 mg 1 tablet by mouth daily. Uric acid: 01/03/2021 5.0.  Discussed the importance of avoiding a purine rich diet. He eats red meat on occasion but he has not experienced a flare.  Uric acid will be rechecked today.  He will remain on allopurinol as prescribed.  He was advised to notify us if he develops a flare.- Plan: Uric acid  Medication monitoring encounter - CBC and CMP will be updated today.  He has been taking advil 200 mg 2 tablet every morning for pain relief. Plan: COMPLETE METABOLIC PANEL WITH GFR, CBC with Differential/Platelet  Primary osteoarthritis of both hands: PIP and DIP thickening consistent with osteoarthritis of both hands. Discussed the importance of joint protection and muscle strengthening. He takes advil 200 mg 2 tablets every morning for pain relief.   Contracture of elbow, bilateral - Unchanged, right > left.  No tenderness or inflammation noted on examination today.   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 Doing well.  He has good ROM with no discomfort.  No groin pain at this time.   Trigger little finger of right hand: He experiences intermittent tenderness and locking.    Orders: Orders Placed This  Encounter  Procedures   Uric acid   COMPLETE METABOLIC PANEL WITH GFR   CBC with Differential/Platelet   No orders of the defined types were placed in this encounter.    Follow-Up Instructions: Return in about 6 months (around 01/31/2022) for Reiter's syndrome, gout, OA.   Ofilia Neas, PA-C  Note - This record has been created using Dragon software.  Chart creation errors have been sought, but may not always  have been located. Such creation errors do not reflect on  the standard of medical care.

## 2021-08-02 ENCOUNTER — Ambulatory Visit (INDEPENDENT_AMBULATORY_CARE_PROVIDER_SITE_OTHER): Payer: Medicare Other | Admitting: Physician Assistant

## 2021-08-02 ENCOUNTER — Other Ambulatory Visit: Payer: Self-pay

## 2021-08-02 ENCOUNTER — Encounter: Payer: Self-pay | Admitting: Physician Assistant

## 2021-08-02 VITALS — BP 157/91 | HR 67 | Ht 70.5 in | Wt 201.0 lb

## 2021-08-02 DIAGNOSIS — M19042 Primary osteoarthritis, left hand: Secondary | ICD-10-CM

## 2021-08-02 DIAGNOSIS — Z5181 Encounter for therapeutic drug level monitoring: Secondary | ICD-10-CM

## 2021-08-02 DIAGNOSIS — M24529 Contracture, unspecified elbow: Secondary | ICD-10-CM

## 2021-08-02 DIAGNOSIS — M0239 Reiter's disease, multiple sites: Secondary | ICD-10-CM | POA: Diagnosis not present

## 2021-08-02 DIAGNOSIS — M19041 Primary osteoarthritis, right hand: Secondary | ICD-10-CM | POA: Diagnosis not present

## 2021-08-02 DIAGNOSIS — M1A09X0 Idiopathic chronic gout, multiple sites, without tophus (tophi): Secondary | ICD-10-CM

## 2021-08-02 DIAGNOSIS — M65351 Trigger finger, right little finger: Secondary | ICD-10-CM

## 2021-08-02 DIAGNOSIS — Z96641 Presence of right artificial hip joint: Secondary | ICD-10-CM

## 2021-08-03 LAB — COMPLETE METABOLIC PANEL WITHOUT GFR
AG Ratio: 1.4 (calc) (ref 1.0–2.5)
ALT: 13 U/L (ref 9–46)
AST: 20 U/L (ref 10–35)
Albumin: 4.3 g/dL (ref 3.6–5.1)
Alkaline phosphatase (APISO): 89 U/L (ref 35–144)
BUN: 14 mg/dL (ref 7–25)
CO2: 29 mmol/L (ref 20–32)
Calcium: 9.6 mg/dL (ref 8.6–10.3)
Chloride: 104 mmol/L (ref 98–110)
Creat: 0.87 mg/dL (ref 0.70–1.28)
Globulin: 3 g/dL (ref 1.9–3.7)
Glucose, Bld: 95 mg/dL (ref 65–99)
Potassium: 5.1 mmol/L (ref 3.5–5.3)
Sodium: 141 mmol/L (ref 135–146)
Total Bilirubin: 0.4 mg/dL (ref 0.2–1.2)
Total Protein: 7.3 g/dL (ref 6.1–8.1)
eGFR: 92 mL/min/1.73m2 (ref >=60)

## 2021-08-03 LAB — CBC WITH DIFFERENTIAL/PLATELET
Absolute Monocytes: 330 {cells}/uL (ref 200–950)
Basophils Absolute: 62 {cells}/uL (ref 0–200)
Basophils Relative: 1.1 %
Eosinophils Absolute: 140 {cells}/uL (ref 15–500)
Eosinophils Relative: 2.5 %
HCT: 46.4 % (ref 38.5–50.0)
Hemoglobin: 15.5 g/dL (ref 13.2–17.1)
Lymphs Abs: 1355 {cells}/uL (ref 850–3900)
MCH: 32.6 pg (ref 27.0–33.0)
MCHC: 33.4 g/dL (ref 32.0–36.0)
MCV: 97.5 fL (ref 80.0–100.0)
MPV: 11.1 fL (ref 7.5–12.5)
Monocytes Relative: 5.9 %
Neutro Abs: 3713 {cells}/uL (ref 1500–7800)
Neutrophils Relative %: 66.3 %
Platelets: 251 Thousand/uL (ref 140–400)
RBC: 4.76 Million/uL (ref 4.20–5.80)
RDW: 12.6 % (ref 11.0–15.0)
Total Lymphocyte: 24.2 %
WBC: 5.6 Thousand/uL (ref 3.8–10.8)

## 2021-08-03 LAB — URIC ACID: Uric Acid, Serum: 5 mg/dL (ref 4.0–8.0)

## 2021-08-03 NOTE — Progress Notes (Signed)
CBC and CMP WNL.  Uric acid is WNL.

## 2022-01-17 NOTE — Progress Notes (Signed)
? ?Office Visit Note ? ?Patient: Bruce Tran             ?Date of Birth: 16-Apr-1951           ?MRN: [id number]             ?PCP: Normand Sloop, MD ?Referring: Quentin Cornwall, MD ?Visit Date: 01/31/2022 ?Occupation: '@GUAROCC'$ @ ? ?Subjective:  ?No chief complaint on file. ? ? ?History of Present Illness: Bruce Tran is a 71 y.o. male with history of Reiter's syndrome, gouty arthropathy and osteoarthritis.  He returns today after his last visit in October 2022.  He has been off methotrexate for at least 3 years without any flare of Reiter's.  He has been taking allopurinol 300 mg daily.  He has not had a gout flare in a long time.  He denies any joint pain or stiffness today.  He has been taking Advil every morning for joint to stiffness related to work. ? ?Activities of Daily Living:  ?Patient reports morning stiffness for a few minutes.   ?Patient Denies nocturnal pain.  ?Difficulty dressing/grooming: Denies ?Difficulty climbing stairs: Denies ?Difficulty getting out of chair: Denies ?Difficulty using hands for taps, buttons, cutlery, and/or writing: Denies ? ?Review of Systems  ?Constitutional:  Negative for fatigue.  ?HENT:  Negative for mouth sores, mouth dryness and nose dryness.   ?Eyes:  Negative for pain, itching and dryness.  ?Respiratory:  Negative for shortness of breath and difficulty breathing.   ?Cardiovascular:  Negative for chest pain and palpitations.  ?Gastrointestinal:  Negative for blood in stool, constipation and diarrhea.  ?Endocrine: Negative for increased urination.  ?Genitourinary:  Negative for difficulty urinating.  ?Musculoskeletal:  Positive for morning stiffness. Negative for joint pain, joint pain, joint swelling, myalgias, muscle tenderness and myalgias.  ?Skin:  Negative for color change, rash and redness.  ?Allergic/Immunologic: Negative for susceptible to infections.  ?Neurological:  Negative for dizziness, numbness, headaches, memory loss and weakness.  ?Hematological:   Negative for bruising/bleeding tendency.  ?Psychiatric/Behavioral:  Negative for confusion.   ? ?PMFS History:  ?Patient Active Problem List  ? Diagnosis Date Noted  ? Inflammatory arthritis 01/02/2017  ? Neck pain 01/02/2017  ? Encounter for long-term current use of high risk medication 01/02/2017  ? High risk medication use 01/02/2017  ? Gout 08/17/2016  ? Reiter's disease ([place]) 08/17/2016  ? Osteoarthritis of right hip 08/17/2016  ?  ?Past Medical History:  ?Diagnosis Date  ? Borderline high cholesterol   ? Gout 08/17/2016  ? Crystal Proven  ? Osteoarthritis of right hip 08/17/2016  ? Reiter's disease ([place]) 08/17/2016  ?  ?Family History  ?Problem Relation Age of Onset  ? Emphysema Mother   ? Emphysema Sister   ? Breast cancer Sister   ? Breast cancer Sister   ? Psoriasis Other   ? Arthritis Other   ?     Psoriatic arthritis  ? Lymphoma Other   ?     Secondary to immunosuppression  ? ?Past Surgical History:  ?Procedure Laterality Date  ? CATARACT EXTRACTION Left 04/12/2020  ? JOINT REPLACEMENT Left 04/2018  ? total hip   ? TONSILLECTOMY    ? ?Social History  ? ?Social History Narrative  ? Not on file  ? ?Immunization History  ?Administered Date(s) Administered  ? Influenza-Unspecified 07/14/2021  ? Moderna Covid-19 Vaccine Bivalent Booster 44yr & up 07/28/2021  ? Moderna Sars-Covid-2 Vaccination 12/19/2019, 01/16/2020, 08/01/2020  ?  ? ?Objective: ?Vital Signs: BP (!) 142/90 (BP Location: Left  Arm, Patient Position: Sitting, Cuff Size: Normal)   Pulse 69   Ht 5' 10.5" (1.791 m)   Wt 200 lb (90.7 kg)   BMI 28.29 kg/m?   ? ?Physical Exam ?Vitals and nursing note reviewed.  ?Constitutional:   ?   Appearance: He is well-developed.  ?HENT:  ?   Head: Normocephalic and atraumatic.  ?Eyes:  ?   Conjunctiva/sclera: Conjunctivae normal.  ?   Pupils: Pupils are equal, round, and reactive to light.  ?Cardiovascular:  ?   Rate and Rhythm: Normal rate and regular rhythm.  ?   Heart sounds: Normal heart sounds.   ?Pulmonary:  ?   Effort: Pulmonary effort is normal.  ?   Breath sounds: Normal breath sounds.  ?Abdominal:  ?   General: Bowel sounds are normal.  ?   Palpations: Abdomen is soft.  ?Musculoskeletal:  ?   Cervical back: Normal range of motion and neck supple.  ?Skin: ?   General: Skin is warm and dry.  ?   Capillary Refill: Capillary refill takes less than 2 seconds.  ?Neurological:  ?   Mental Status: He is alert and oriented to person, place, and time.  ?Psychiatric:     ?   Behavior: Behavior normal.  ?  ? ?Musculoskeletal Exam: He had good range of motion of the cervical thoracic and lumbar spine.  There was no SI joint tenderness.  Shoulder joints were in good range of motion.  He had contractures in bilateral elbows with no synovitis.  He had PIP and DIP thickening with incomplete extension of PIP and DIP joints.  Flexor tendon thickening was noted bilaterally.  Hip joints and knee joints in good range of motion.  There was no tenderness or ankles or MTPs.  There was no evidence of Achilles tendinitis or planter fasciitis. ? ?CDAI Exam: ?CDAI Score: -- ?Patient Global: --; Provider Global: -- ?Swollen: --; Tender: -- ?Joint Exam 01/31/2022  ? ?No joint exam has been documented for this visit  ? ?There is currently no information documented on the homunculus. Go to the Rheumatology activity and complete the homunculus joint exam. ? ?Investigation: ?No additional findings. ? ?Imaging: ?No results found. ? ?Recent Labs: ?Lab Results  ?Component Value Date  ? WBC 5.6 08/02/2021  ? HGB 15.5 08/02/2021  ? PLT 251 08/02/2021  ? NA 141 08/02/2021  ? K 5.1 08/02/2021  ? CL 104 08/02/2021  ? CO2 29 08/02/2021  ? GLUCOSE 95 08/02/2021  ? BUN 14 08/02/2021  ? CREATININE 0.87 08/02/2021  ? BILITOT 0.4 08/02/2021  ? ALKPHOS 112 08/15/2017  ? AST 20 08/02/2021  ? ALT 13 08/02/2021  ? PROT 7.3 08/02/2021  ? ALBUMIN 4.3 08/15/2017  ? CALCIUM 9.6 08/02/2021  ? GFRAA 80 06/08/2020  ? ? ?Speciality Comments: No specialty  comments available. ? ?Procedures:  ?No procedures performed ?Allergies: Patient has no known allergies.  ? ?Assessment / Plan:     ?Visit Diagnoses: Reiter's disease of multiple sites ([place]) - In remission.  He discontinued Methotrexate in May 2019.  He took long-term prednisone in the past.  He has been off prednisone for a while.  He had no synovitis on examination today. ? ?Idiopathic chronic gout of multiple sites without tophus -he has been taking allopurinol 300 mg 1 tablet by mouth daily. uric acid: 08/02/2021 5.0.  He has not had a gout flare in a long time.- Plan: Uric acid ? ?Medication monitoring encounter - Plan: CBC with Differential/Platelet, COMPLETE METABOLIC PANEL  WITH GFR ? ?Primary osteoarthritis of both hands-he had PIP and DIP thickening with incomplete extension of his bilateral hands.  No synovitis was noted. ? ?Contracture of elbow, bilateral - Unchanged, right > left.  No synovitis was noted. ? 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  Doing well. ? ?Orders: ?Orders Placed This Encounter  ?Procedures  ? CBC with Differential/Platelet  ? COMPLETE METABOLIC PANEL WITH GFR  ? Uric acid  ? ?No orders of the defined types were placed in this encounter. ? ? ? ?Follow-Up Instructions: Return in about 6 months (around 08/02/2022) for Gout. ? ? ?Bo Merino, MD ? ?Note - This record has been created using [employer].  ?Chart creation errors have been sought, but may not always  ?have been located. Such creation errors do not reflect on  ?the standard of medical care.  ?

## 2022-01-31 ENCOUNTER — Encounter: Payer: Self-pay | Admitting: Rheumatology

## 2022-01-31 ENCOUNTER — Ambulatory Visit (INDEPENDENT_AMBULATORY_CARE_PROVIDER_SITE_OTHER): Payer: Medicare Other | Admitting: Rheumatology

## 2022-01-31 VITALS — BP 142/90 | HR 69 | Ht 70.5 in | Wt 200.0 lb

## 2022-01-31 DIAGNOSIS — Z5181 Encounter for therapeutic drug level monitoring: Secondary | ICD-10-CM

## 2022-01-31 DIAGNOSIS — M1A09X0 Idiopathic chronic gout, multiple sites, without tophus (tophi): Secondary | ICD-10-CM

## 2022-01-31 DIAGNOSIS — M24529 Contracture, unspecified elbow: Secondary | ICD-10-CM

## 2022-01-31 DIAGNOSIS — M19041 Primary osteoarthritis, right hand: Secondary | ICD-10-CM | POA: Diagnosis not present

## 2022-01-31 DIAGNOSIS — M65351 Trigger finger, right little finger: Secondary | ICD-10-CM

## 2022-01-31 DIAGNOSIS — Z96641 Presence of right artificial hip joint: Secondary | ICD-10-CM

## 2022-01-31 DIAGNOSIS — M19042 Primary osteoarthritis, left hand: Secondary | ICD-10-CM

## 2022-01-31 DIAGNOSIS — M0239 Reiter's disease, multiple sites: Secondary | ICD-10-CM | POA: Diagnosis not present

## 2022-02-01 LAB — COMPLETE METABOLIC PANEL WITH GFR
AG Ratio: 1.5 (calc) (ref 1.0–2.5)
ALT: 16 U/L (ref 9–46)
AST: 27 U/L (ref 10–35)
Albumin: 4.6 g/dL (ref 3.6–5.1)
Alkaline phosphatase (APISO): 102 U/L (ref 35–144)
BUN: 17 mg/dL (ref 7–25)
CO2: 28 mmol/L (ref 20–32)
Calcium: 10 mg/dL (ref 8.6–10.3)
Chloride: 104 mmol/L (ref 98–110)
Creat: 1.03 mg/dL (ref 0.70–1.28)
Globulin: 3.1 g/dL (calc) (ref 1.9–3.7)
Glucose, Bld: 95 mg/dL (ref 65–99)
Potassium: 5.3 mmol/L (ref 3.5–5.3)
Sodium: 141 mmol/L (ref 135–146)
Total Bilirubin: 0.5 mg/dL (ref 0.2–1.2)
Total Protein: 7.7 g/dL (ref 6.1–8.1)
eGFR: 77 mL/min/{1.73_m2} (ref >=60)

## 2022-02-01 LAB — CBC WITH DIFFERENTIAL/PLATELET
Absolute Monocytes: 433 cells/uL (ref 200–950)
Basophils Absolute: 73 cells/uL (ref 0–200)
Basophils Relative: 1.2 %
Eosinophils Absolute: 171 cells/uL (ref 15–500)
Eosinophils Relative: 2.8 %
HCT: 47.8 % (ref 38.5–50.0)
Hemoglobin: 15.8 g/dL (ref 13.2–17.1)
Lymphs Abs: 1940 cells/uL (ref 850–3900)
MCH: 31.3 pg (ref 27.0–33.0)
MCHC: 33.1 g/dL (ref 32.0–36.0)
MCV: 94.7 fL (ref 80.0–100.0)
MPV: 10.8 fL (ref 7.5–12.5)
Monocytes Relative: 7.1 %
Neutro Abs: 3483 cells/uL (ref 1500–7800)
Neutrophils Relative %: 57.1 %
Platelets: 261 10*3/uL (ref 140–400)
RBC: 5.05 10*6/uL (ref 4.20–5.80)
RDW: 13.1 % (ref 11.0–15.0)
Total Lymphocyte: 31.8 %
WBC: 6.1 10*3/uL (ref 3.8–10.8)

## 2022-02-01 LAB — URIC ACID: Uric Acid, Serum: 5.1 mg/dL (ref 4.0–8.0)

## 2022-02-01 NOTE — Progress Notes (Signed)
CBC and CMP normal.  Uric acid 5.  1 which is in the desirable range.

## 2022-06-17 ENCOUNTER — Other Ambulatory Visit: Payer: Self-pay | Admitting: Physician Assistant

## 2022-06-18 NOTE — Telephone Encounter (Signed)
Next Visit: 08/01/2022  Last Visit: 01/31/2022  Last Fill: 03/29/2021  DX: Idiopathic chronic gout of multiple sites without tophus  Current Dose per office note 01/31/2022: allopurinol 300 mg 1 tablet by mouth daily  Labs: 01/31/2022 CBC and CMP normal.  Uric acid 5.1 which is in the desirable range.  Okay to refill Allopurinol?

## 2022-07-19 NOTE — Progress Notes (Unsigned)
Office Visit Note  Patient: Bruce Tran             Date of Birth: Jan 15, 1951           MRN: [id number]             PCP: Normand Sloop, MD Referring: Normand Sloop, MD Visit Date: 08/01/2022 Occupation: '@GUAROCC'$ @  Subjective:  Medication monitoring  History of Present Illness: Bruce Tran is a 71 y.o. male with history of reiter's disease, gout, and osteoarthritis. He denies any signs or symptoms of a flare.  He is taking allopurinol 300 mg 1 tablet by mouth daily.  He continues to tolerate allopurinol without any side effects.  Patient remains extremely active on a daily basis.  He has been stretching every morning which has been helpful to get him going for the day.  He takes ibuprofen 200 mg 3 tablets every morning. He denies any new medical conditions.  Activities of Daily Living:  Patient reports morning stiffness for a few minutes.   Patient Denies nocturnal pain.  Difficulty dressing/grooming: Denies Difficulty climbing stairs: Denies Difficulty getting out of chair: Denies Difficulty using hands for taps, buttons, cutlery, and/or writing: Denies  Review of Systems  Constitutional:  Negative for fatigue.  HENT:  Negative for mouth sores and mouth dryness.   Eyes:  Negative for dryness.  Respiratory:  Negative for shortness of breath.   Cardiovascular:  Negative for chest pain and palpitations.  Gastrointestinal:  Negative for blood in stool, constipation and diarrhea.  Endocrine: Negative for increased urination.  Genitourinary:  Negative for involuntary urination.  Musculoskeletal:  Positive for morning stiffness. Negative for joint pain, gait problem, joint pain, joint swelling, myalgias, muscle weakness, muscle tenderness and myalgias.  Skin:  Negative for color change, rash, hair loss and sensitivity to sunlight.  Allergic/Immunologic: Negative for susceptible to infections.  Neurological:  Negative for dizziness and headaches.  Hematological:   Negative for swollen glands.  Psychiatric/Behavioral:  Negative for depressed mood and sleep disturbance. The patient is not nervous/anxious.     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[place]) 08/17/2016   Osteoarthritis of right hip 08/17/2016    Past Medical History:  Diagnosis Date   Borderline high cholesterol    Gout 08/17/2016   Crystal Proven   Osteoarthritis of right hip 08/17/2016   Reiter's disease ([place]) 08/17/2016    Family History  Problem Relation Age of Onset   Emphysema Mother    Emphysema Sister    Breast cancer Sister    Breast cancer Sister    Psoriasis Other    Arthritis Other        Psoriatic arthritis   Lymphoma Other        Secondary to immunosuppression   Past Surgical History:  Procedure Laterality Date   CATARACT EXTRACTION Left 04/12/2020   CYST REMOVAL NECK  07/30/2022   JOINT REPLACEMENT Left 04/2018   total hip    TONSILLECTOMY     Social History   Social History Narrative   Not on file   Immunization History  Administered Date(s) Administered   Influenza-Unspecified 07/14/2021   Moderna Covid-19 Vaccine Bivalent Booster 67yr & up 07/28/2021   Moderna Sars-Covid-2 Vaccination 11/20/2019, 12/19/2019, 01/16/2020, 08/01/2020  Objective: Vital Signs: BP (!) 145/89 (BP Location: Left Arm, Patient Position: Sitting, Cuff Size: Normal)   Pulse 65   Resp 15   Ht 5' 10.5" (1.791 m)   Wt 203 lb (92.1 kg)   BMI 28.72 kg/m    Physical Exam Vitals and nursing note reviewed.  Constitutional:      Appearance: He is well-developed.  HENT:     Head: Normocephalic and atraumatic.  Eyes:     Conjunctiva/sclera: Conjunctivae normal.     Pupils: Pupils are equal, round, and reactive to light.   Cardiovascular:     Rate and Rhythm: Normal rate and regular rhythm.     Heart sounds: Normal heart sounds.  Pulmonary:     Effort: Pulmonary effort is normal.     Breath sounds: Normal breath sounds.  Abdominal:     General: Bowel sounds are normal.     Palpations: Abdomen is soft.  Musculoskeletal:     Cervical back: Normal range of motion and neck supple.  Skin:    General: Skin is warm and dry.     Capillary Refill: Capillary refill takes less than 2 seconds.  Neurological:     Mental Status: He is alert and oriented to person, place, and time.  Psychiatric:        Behavior: Behavior normal.      Musculoskeletal Exam: C-spine, thoracic spine, lumbar spine have good range of motion.  No midline spinal tenderness.  No SI joint tenderness.  Shoulder joints have good range of motion with no discomfort.  Bilateral elbow joint contractures noted.  No tenderness or synovitis over elbow joints or wrist joints.  PIP and DIP thickening consistent with osteoarthritis of both hands.  Incomplete extension of the PIP joints.  Flexor tendon thickening especially in the right hand noted.  Right hip replacement has good range of motion.  Left hip is slightly limited range of motion.  Both knee joints have good range of motion with no warmth or effusion.  Ankle joints have good range of motion with no tenderness or joint swelling.  No evidence of Achilles tendinitis.  CDAI Exam: CDAI Score: -- Patient Global: --; Provider Global: -- Swollen: --; Tender: -- Joint Exam 08/01/2022   No joint exam has been documented for this visit   There is currently no information documented on the homunculus. Go to the Rheumatology activity and complete the homunculus joint exam.  Investigation: No additional findings.  Imaging: No results found.  Recent Labs: Lab Results  Component Value Date   WBC 6.1 01/31/2022   HGB 15.8 01/31/2022   PLT 261 01/31/2022   NA 141 01/31/2022   K 5.3 01/31/2022    CL 104 01/31/2022   CO2 28 01/31/2022   GLUCOSE 95 01/31/2022   BUN 17 01/31/2022   CREATININE 1.03 01/31/2022   BILITOT 0.5 01/31/2022   ALKPHOS 112 08/15/2017   AST 27 01/31/2022   ALT 16 01/31/2022   PROT 7.7 01/31/2022   ALBUMIN 4.3 08/15/2017   CALCIUM 10.0 01/31/2022   GFRAA 80 06/08/2020    Speciality Comments: No specialty comments available.  Procedures:  No procedures performed Allergies: Patient has no known allergies.     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  He discontinued Methotrexate in May 2019.  He took long-term prednisone in the past.  He has not had any signs or symptoms of a flare.  No synovitis was noted  on examination today.  He takes ibuprofen 200 mg 3 tablets every morning and remains extremely active on a daily basis.  He was advised to notify us if he develops signs or symptoms of active disease.  Idiopathic chronic gout of multiple sites without tophus - He has not had any signs or symptoms of a gout flare.  He has clinically been doing well taking allopurinol 300 mg 1 tablet by mouth daily. uric acid: 5.1 on 01/31/2022.  Uric acid level be rechecked today.  He was advised to notify us if he develops signs or symptoms of a gout flare.- Plan: Uric acid  Medication monitoring encounter -  Uric acid was within the desirable range-5.1 on 01/31/22.   CBC and CMP WNL on 01/31/22.   The following lab work will be updated today.   Plan: CBC with Differential/Platelet, COMPLETE METABOLIC PANEL WITH GFR, Uric acid  Primary osteoarthritis of both hands: PIP and DIP thickening consistent with osteoarthritis of both hands.  Incomplete extension of PIP joints.  He experiences intermittent pain and stiffness but has not noticed any joint swelling.  He takes ibuprofen 200 mg 3 tablets in the morning on a daily basis. Patient remains very active using his hands on a daily basis.  Discussed the importance of joint  protection and muscle strengthening.  Contracture of elbow, bilateral - Unchanged, right > left.  No tenderness or inflammation noted on examination today.  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  Doing well.  Good range of motion with no discomfort at this time.  Orders: Orders Placed This Encounter  Procedures   CBC with Differential/Platelet   COMPLETE METABOLIC PANEL WITH GFR   Uric acid   No orders of the defined types were placed in this encounter.   Follow-Up Instructions: Return in about 6 months (around 01/31/2023) for Gout, Reiter's.   Ofilia Neas, PA-C  Note - This record has been created using Dragon software.  Chart creation errors have been sought, but may not always  have been located. Such creation errors do not reflect on  the standard of medical care.

## 2022-07-30 HISTORY — PX: CYST REMOVAL NECK: SHX6281

## 2022-08-01 ENCOUNTER — Ambulatory Visit: Payer: Medicare Other | Attending: Physician Assistant | Admitting: Physician Assistant

## 2022-08-01 ENCOUNTER — Encounter: Payer: Self-pay | Admitting: Physician Assistant

## 2022-08-01 VITALS — BP 145/89 | HR 65 | Resp 15 | Ht 70.5 in | Wt 203.0 lb

## 2022-08-01 DIAGNOSIS — Z5181 Encounter for therapeutic drug level monitoring: Secondary | ICD-10-CM | POA: Diagnosis present

## 2022-08-01 DIAGNOSIS — M19042 Primary osteoarthritis, left hand: Secondary | ICD-10-CM | POA: Insufficient documentation

## 2022-08-01 DIAGNOSIS — M0239 Reiter's disease, multiple sites: Secondary | ICD-10-CM | POA: Insufficient documentation

## 2022-08-01 DIAGNOSIS — Z96641 Presence of right artificial hip joint: Secondary | ICD-10-CM | POA: Diagnosis present

## 2022-08-01 DIAGNOSIS — M1A09X0 Idiopathic chronic gout, multiple sites, without tophus (tophi): Secondary | ICD-10-CM | POA: Diagnosis present

## 2022-08-01 DIAGNOSIS — M19041 Primary osteoarthritis, right hand: Secondary | ICD-10-CM | POA: Insufficient documentation

## 2022-08-01 DIAGNOSIS — M24529 Contracture, unspecified elbow: Secondary | ICD-10-CM | POA: Insufficient documentation

## 2022-08-01 LAB — CBC WITH DIFFERENTIAL/PLATELET
Absolute Monocytes: 422 cells/uL (ref 200–950)
Basophils Absolute: 61 cells/uL (ref 0–200)
Basophils Relative: 0.9 %
Eosinophils Absolute: 231 cells/uL (ref 15–500)
Eosinophils Relative: 3.4 %
HCT: 47.8 % (ref 38.5–50.0)
Hemoglobin: 15.7 g/dL (ref 13.2–17.1)
Lymphs Abs: 1768 cells/uL (ref 850–3900)
MCH: 31.7 pg (ref 27.0–33.0)
MCHC: 32.8 g/dL (ref 32.0–36.0)
MCV: 96.6 fL (ref 80.0–100.0)
MPV: 10.5 fL (ref 7.5–12.5)
Monocytes Relative: 6.2 %
Neutro Abs: 4318 cells/uL (ref 1500–7800)
Neutrophils Relative %: 63.5 %
Platelets: 247 10*3/uL (ref 140–400)
RBC: 4.95 10*6/uL (ref 4.20–5.80)
RDW: 12.6 % (ref 11.0–15.0)
Total Lymphocyte: 26 %
WBC: 6.8 10*3/uL (ref 3.8–10.8)

## 2022-08-01 LAB — COMPLETE METABOLIC PANEL WITH GFR
AG Ratio: 1.5 (calc) (ref 1.0–2.5)
ALT: 11 U/L (ref 9–46)
AST: 20 U/L (ref 10–35)
Albumin: 4.4 g/dL (ref 3.6–5.1)
Alkaline phosphatase (APISO): 97 U/L (ref 35–144)
BUN: 25 mg/dL (ref 7–25)
CO2: 30 mmol/L (ref 20–32)
Calcium: 9.5 mg/dL (ref 8.6–10.3)
Chloride: 103 mmol/L (ref 98–110)
Creat: 1.04 mg/dL (ref 0.70–1.28)
Globulin: 2.9 g/dL (calc) (ref 1.9–3.7)
Glucose, Bld: 87 mg/dL (ref 65–99)
Potassium: 4.8 mmol/L (ref 3.5–5.3)
Sodium: 139 mmol/L (ref 135–146)
Total Bilirubin: 0.4 mg/dL (ref 0.2–1.2)
Total Protein: 7.3 g/dL (ref 6.1–8.1)
eGFR: 76 mL/min/{1.73_m2} (ref >=60)

## 2022-08-01 LAB — URIC ACID: Uric Acid, Serum: 5.6 mg/dL (ref 4.0–8.0)

## 2022-08-01 NOTE — Progress Notes (Signed)
CBC WNL

## 2022-08-01 NOTE — Progress Notes (Signed)
CMP within normal limits.  Uric acid within the desirable range.

## 2022-10-17 ENCOUNTER — Other Ambulatory Visit: Payer: Self-pay | Admitting: Physician Assistant

## 2022-10-17 NOTE — Telephone Encounter (Signed)
Next Visit: 02/14/2023  Last Visit: 08/01/2022  Last Fill: 06/18/2022  DX: Idiopathic chronic gout of multiple sites without tophus   Current Dose per office note 08/01/2022: allopurinol 300 mg 1 tablet by mouth daily   Labs: 08/01/2022 CBC WNL CMP within normal limits.  Uric acid within the desirable range.   Okay to refill Allopurinol?

## 2023-01-31 NOTE — Progress Notes (Unsigned)
Office Visit Note  Patient: Bruce Tran             Date of Birth: [birth year]           MRN: [id number]             PCP: Exie Parody, MD Referring: Exie Parody, MD Visit Date: 02/14/2023 Occupation: @GUAROCC @  Subjective:  Medication monitoring  History of Present Illness: Bruce Tran is a 72 y.o. male with history of Reiter's disease, gout, and osteoarthritis.  He is taking allopurinol 300 mg 1 tablet by mouth daily.  He is tolerating allopurinol without any side effects and has not missed any doses recently.  He denies any signs or symptoms of a gout flare.  He continues to experience some pain and stiffness in both hands but denies any joint swelling at this time.  He has been taking ibuprofen 400 mg every morning which she feels helps to alleviate his arthralgias and joint stiffness.  He remains very active performing household responsibilities and yard work.  He exercises on a daily basis.   Activities of Daily Living:  Patient reports morning stiffness for 0 minutes.   Patient Denies nocturnal pain.  Difficulty dressing/grooming: Denies Difficulty climbing stairs: Denies Difficulty getting out of chair: Denies Difficulty using hands for taps, buttons, cutlery, and/or writing: Denies  Review of Systems  Constitutional:  Negative for fatigue.  HENT:  Negative for mouth sores and mouth dryness.   Eyes:  Negative for dryness.  Respiratory:  Negative for shortness of breath.   Cardiovascular:  Negative for chest pain and palpitations.  Gastrointestinal:  Negative for blood in stool, constipation and diarrhea.  Endocrine: Negative for increased urination.  Genitourinary:  Negative for involuntary urination.  Musculoskeletal:  Negative for joint pain, gait problem, joint pain, joint swelling, myalgias, muscle weakness, morning stiffness, muscle tenderness and myalgias.  Skin:  Negative for color change, rash, hair loss and sensitivity to sunlight.   Allergic/Immunologic: Negative for susceptible to infections.  Neurological:  Negative for dizziness and headaches.  Hematological:  Negative for swollen glands.  Psychiatric/Behavioral:  Negative for depressed mood and sleep disturbance. The patient is not nervous/anxious.     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[facility]) 08/17/2016   Osteoarthritis of right hip 08/17/2016    Past Medical History:  Diagnosis Date   Borderline high cholesterol    Gout 08/17/2016   Crystal Proven   Osteoarthritis of right hip 08/17/2016   Reiter's disease ([facility]) 08/17/2016    Family History  Problem Relation Age of Onset   Emphysema Mother    Emphysema Sister    Breast cancer Sister    Breast cancer Sister    Psoriasis Other    Arthritis Other        Psoriatic arthritis   Lymphoma Other        Secondary to immunosuppression   Past Surgical History:  Procedure Laterality Date   CATARACT EXTRACTION Left 04/12/2020   CYST REMOVAL NECK  07/30/2022   JOINT REPLACEMENT Left 04/2018   total hip    TONSILLECTOMY     Social History   Social History Narrative   Not on file   Immunization History  Administered Date(s) Administered   Covid-19, Mrna,Vaccine(Spikevax)32yrs  and older 07/24/2022   Influenza-Unspecified 07/14/2021   Moderna Covid-19 Vaccine Bivalent Booster 48yrs & up 07/28/2021   Moderna Sars-Covid-2 Vaccination 11/20/2019, 12/19/2019, 01/16/2020, 08/01/2020     Objective: Vital Signs: BP (!) 146/87 (BP Location: Right Arm, Patient Position: Sitting, Cuff Size: Normal)   Pulse 67   Resp 15   Ht 5' 10.5" (1.791 m)   Wt 200 lb 6.4 oz (90.9 kg)   BMI 28.35 kg/m    Physical Exam Vitals and nursing note reviewed.  Constitutional:       Appearance: He is well-developed.  HENT:     Head: Normocephalic and atraumatic.  Eyes:     Conjunctiva/sclera: Conjunctivae normal.     Pupils: Pupils are equal, round, and reactive to light.  Cardiovascular:     Rate and Rhythm: Normal rate and regular rhythm.     Heart sounds: Normal heart sounds.  Pulmonary:     Effort: Pulmonary effort is normal.     Breath sounds: Normal breath sounds.  Abdominal:     General: Bowel sounds are normal.     Palpations: Abdomen is soft.  Musculoskeletal:     Cervical back: Normal range of motion and neck supple.  Skin:    General: Skin is warm and dry.     Capillary Refill: Capillary refill takes less than 2 seconds.  Neurological:     Mental Status: He is alert and oriented to person, place, and time.  Psychiatric:        Behavior: Behavior normal.      Musculoskeletal Exam: C-spine has limited range of motion with lateral rotation.  Shoulder joints have good range of motion.  Bilateral elbow joint contractures noted.  No tenderness or inflammation over the elbow joints.  Wrist joints have good range of motion.  PIP and DIP thickening consistent with osteoarthritis of both hands.  Incomplete extension of PIP joints.  Flexor tendon thickening especially in the right hand noted.  Right hip replacement has good range of motion.  Left hip is slightly limited range of motion.  Knee joints have good range of motion with no warmth or effusion.  Ankle joints have good range of motion with no tenderness or joint swelling.  CDAI Exam: CDAI Score: -- Patient Global: --; Provider Global: -- Swollen: --; Tender: -- Joint Exam 02/14/2023   No joint exam has been documented for this visit   There is currently no information documented on the homunculus. Go to the Rheumatology activity and complete the homunculus joint exam.  Investigation: No additional findings.  Imaging: No results found.  Recent Labs: Lab Results  Component Value Date   WBC  6.8 08/01/2022   HGB 15.7 08/01/2022   PLT 247 08/01/2022   NA 139 08/01/2022   K 4.8 08/01/2022   CL 103 08/01/2022   CO2 30 08/01/2022   GLUCOSE 87 08/01/2022   BUN 25 08/01/2022   CREATININE 1.04 08/01/2022   BILITOT 0.4 08/01/2022   ALKPHOS 112 08/15/2017   AST 20 08/01/2022   ALT 11 08/01/2022   PROT 7.3 08/01/2022   ALBUMIN 4.3 08/15/2017   CALCIUM 9.5 08/01/2022   GFRAA 80 06/08/2020    Speciality Comments: No specialty comments available.  Procedures:  No procedures performed Allergies: Patient has no known allergies.   Assessment / Plan:     Visit Diagnoses: Reiter's disease of multiple sites ([facility]) - - In remission.  He discontinued Methotrexate in May 2019.  He took long-term prednisone in the past.  No signs or symptoms of a flare.  No need for immunosuppressive therapy at this time.  He was advised to notify us if he develops signs or symptoms of a recurrence.  He will follow up in 6 months or sooner if needed.   Idiopathic chronic gout of multiple sites without tophus - He has not had any signs or symptoms of a gout flare.  He has clinically been doing well taking allopurinol 300 mg 1 tablet by mouth daily.  He is tolerating allopurinol without any side effects and has not missed any doses recently.  His uric acid level was within desirable range: 5.6 on 08/01/2022.  Uric acid along with CBC and CMP will be updated today.  He will remain on allopurinol as prescribed.  Discussed the importance of avoiding a purine rich diet.  He was advised to notify us if he develops signs or symptoms of a gout flare.  A refill of allopurinol was sent to the pharmacy today.- Plan: Uric acid  Medication monitoring encounter -Allopurinol 300 mg 1 tablet daily and ibuprofen 400 mg daily. CBC and CMP updated on 08/01/22.  CBC and CMP were updated today. Uric acid WNL-5.6 on 08/01/22.  Uric acid will be updated today.   Plan: COMPLETE METABOLIC PANEL WITH GFR, CBC with Differential/Platelet,  Uric acid  Primary osteoarthritis of both hands: He has PIP and DIP thickening consistent with osteoarthritis of both hands.  No synovitis noted.  Discussed the importance of joint protection and muscle strengthening.  He takes ibuprofen 400 mg daily which helps to alleviate his discomfort.  Contracture of elbow, bilateral: Unchanged.  No tenderness or inflammation noted.  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  Doing well.  Good range of motion with no discomfort.  Trigger little finger of right hand: Asymptomatic currently.  Orders: Orders Placed This Encounter  Procedures   COMPLETE METABOLIC PANEL WITH GFR   CBC with Differential/Platelet   Uric acid   Meds ordered this encounter  Medications   allopurinol (ZYLOPRIM) 300 MG tablet    Sig: Take 1 tablet (300 mg total) by mouth daily.    Dispense:  90 tablet    Refill:  0      Follow-Up Instructions: Return in about 6 months (around 08/17/2023) for Gout, Osteoarthritis.   Gearldine Bienenstock, PA-C  Note - This record has been created using Dragon software.  Chart creation errors have been sought, but may not always  have been located. Such creation errors do not reflect on  the standard of medical care.

## 2023-02-14 ENCOUNTER — Encounter: Payer: Self-pay | Admitting: Physician Assistant

## 2023-02-14 ENCOUNTER — Ambulatory Visit: Payer: Medicare Other | Attending: Rheumatology | Admitting: Physician Assistant

## 2023-02-14 VITALS — BP 146/87 | HR 67 | Resp 15 | Ht 70.5 in | Wt 200.4 lb

## 2023-02-14 DIAGNOSIS — M1A09X0 Idiopathic chronic gout, multiple sites, without tophus (tophi): Secondary | ICD-10-CM | POA: Diagnosis present

## 2023-02-14 DIAGNOSIS — M24529 Contracture, unspecified elbow: Secondary | ICD-10-CM | POA: Insufficient documentation

## 2023-02-14 DIAGNOSIS — M0239 Reiter's disease, multiple sites: Secondary | ICD-10-CM | POA: Diagnosis present

## 2023-02-14 DIAGNOSIS — M65351 Trigger finger, right little finger: Secondary | ICD-10-CM | POA: Insufficient documentation

## 2023-02-14 DIAGNOSIS — M19041 Primary osteoarthritis, right hand: Secondary | ICD-10-CM | POA: Diagnosis present

## 2023-02-14 DIAGNOSIS — M19042 Primary osteoarthritis, left hand: Secondary | ICD-10-CM | POA: Diagnosis present

## 2023-02-14 DIAGNOSIS — Z96641 Presence of right artificial hip joint: Secondary | ICD-10-CM | POA: Diagnosis present

## 2023-02-14 DIAGNOSIS — Z5181 Encounter for therapeutic drug level monitoring: Secondary | ICD-10-CM | POA: Diagnosis present

## 2023-02-14 MED ORDER — ALLOPURINOL 300 MG PO TABS: 300.0000 mg | ORAL_TABLET | Freq: Every day | ORAL | 0 refills | Status: DC

## 2023-02-15 LAB — COMPLETE METABOLIC PANEL WITH GFR
AG Ratio: 1.3 (calc) (ref 1.0–2.5)
ALT: 10 U/L (ref 9–46)
AST: 19 U/L (ref 10–35)
Albumin: 4.3 g/dL (ref 3.6–5.1)
Alkaline phosphatase (APISO): 104 U/L (ref 35–144)
BUN: 17 mg/dL (ref 7–25)
CO2: 27 mmol/L (ref 20–32)
Calcium: 9.2 mg/dL (ref 8.6–10.3)
Chloride: 102 mmol/L (ref 98–110)
Creat: 1.02 mg/dL (ref 0.70–1.28)
Globulin: 3.3 g/dL (calc) (ref 1.9–3.7)
Glucose, Bld: 87 mg/dL (ref 65–99)
Potassium: 4.1 mmol/L (ref 3.5–5.3)
Sodium: 138 mmol/L (ref 135–146)
Total Bilirubin: 0.5 mg/dL (ref 0.2–1.2)
Total Protein: 7.6 g/dL (ref 6.1–8.1)
eGFR: 78 mL/min/{1.73_m2} (ref >=60)

## 2023-02-15 LAB — CBC WITH DIFFERENTIAL/PLATELET
Absolute Monocytes: 533 cells/uL (ref 200–950)
Basophils Absolute: 67 cells/uL (ref 0–200)
Basophils Relative: 0.9 %
Eosinophils Absolute: 192 cells/uL (ref 15–500)
Eosinophils Relative: 2.6 %
HCT: 46.4 % (ref 38.5–50.0)
Hemoglobin: 15.5 g/dL (ref 13.2–17.1)
Lymphs Abs: 1539 cells/uL (ref 850–3900)
MCH: 31.2 pg (ref 27.0–33.0)
MCHC: 33.4 g/dL (ref 32.0–36.0)
MCV: 93.4 fL (ref 80.0–100.0)
MPV: 10.4 fL (ref 7.5–12.5)
Monocytes Relative: 7.2 %
Neutro Abs: 5069 cells/uL (ref 1500–7800)
Neutrophils Relative %: 68.5 %
Platelets: 268 10*3/uL (ref 140–400)
RBC: 4.97 10*6/uL (ref 4.20–5.80)
RDW: 13.4 % (ref 11.0–15.0)
Total Lymphocyte: 20.8 %
WBC: 7.4 10*3/uL (ref 3.8–10.8)

## 2023-02-15 LAB — URIC ACID: Uric Acid, Serum: 5.2 mg/dL (ref 4.0–8.0)

## 2023-02-15 NOTE — Progress Notes (Signed)
CBC and CMP WNL.  Uric acid WNL.

## 2023-06-17 ENCOUNTER — Other Ambulatory Visit: Payer: Self-pay | Admitting: Physician Assistant

## 2023-06-17 NOTE — Telephone Encounter (Signed)
Last Fill: 02/14/2023  Labs: 02/14/2023 CBC and CMP WNL Uric acid WNL  Next Visit: 08/16/2023  Last Visit: 02/14/2023  DX: Reiter's disease of multiple sites   Current Dose per office note 02/14/2023: allopurinol 300 mg 1 tablet by mouth daily   Okay to refill Allopurinol?

## 2023-08-02 NOTE — Progress Notes (Signed)
Office Visit Note  Patient: Bruce Tran             Date of Birth: 1951-09-28           MRN: [id number]             PCP: Exie Parody, MD Referring: Exie Parody, MD Visit Date: 08/16/2023 Occupation: @GUAROCC @  Subjective:  Medication monitoring  History of Present Illness: Bruce Tran is a 72 y.o. male with Reiter's and gout.  Patient states that he has been taking allopurinol 300 mg daily without any interruption.  He has not had any gout flare.  He is not very careful with his diet.  He also drinks alcohol occasionally.  He continues to have some stiffness in his hands.  He states he is very active and does yard work and other activities which involve his hands.  He does core strengthening exercises on a daily basis.    Activities of Daily Living:  Patient reports morning stiffness for 0 minutes.   Patient Denies nocturnal pain.  Difficulty dressing/grooming: Denies Difficulty climbing stairs: Denies Difficulty getting out of chair: Denies Difficulty using hands for taps, buttons, cutlery, and/or writing: Denies  Review of Systems  Constitutional:  Negative for fatigue.  HENT:  Negative for mouth sores and mouth dryness.   Eyes:  Negative for dryness.  Respiratory:  Negative for shortness of breath.   Cardiovascular:  Negative for chest pain and palpitations.  Gastrointestinal:  Negative for blood in stool, constipation and diarrhea.  Endocrine: Negative for increased urination.  Genitourinary:  Negative for involuntary urination.  Musculoskeletal:  Negative for joint pain, gait problem, joint pain, joint swelling, myalgias, muscle weakness, morning stiffness, muscle tenderness and myalgias.  Skin:  Negative for color change, rash, hair loss and sensitivity to sunlight.  Allergic/Immunologic: Negative for susceptible to infections.  Neurological:  Negative for dizziness and headaches.  Hematological:  Negative for swollen glands.   Psychiatric/Behavioral:  Negative for depressed mood and sleep disturbance. The patient is not nervous/anxious.     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HCC) 08/17/2016   Osteoarthritis of right hip 08/17/2016    Past Medical History:  Diagnosis Date   Borderline high cholesterol    Gout 08/17/2016   Crystal Proven   Osteoarthritis of right hip 08/17/2016   Reiter's disease (HCC) 08/17/2016    Family History  Problem Relation Age of Onset   Emphysema Mother    Emphysema Sister    Breast cancer Sister    Breast cancer Sister    Psoriasis Other    Arthritis Other        Psoriatic arthritis   Lymphoma Other        Secondary to immunosuppression   Past Surgical History:  Procedure Laterality Date   CATARACT EXTRACTION Left 04/12/2020   CYST REMOVAL NECK  07/30/2022   JOINT REPLACEMENT Left 04/2018   total hip    TONSILLECTOMY     Social History   Social History Narrative   Not on file   Immunization History  Administered Date(s) Administered   Influenza-Unspecified 07/14/2021   Moderna Covid-19 Fall Seasonal Vaccine 41yrs & older 07/24/2022   Moderna Covid-19 Vaccine Bivalent Booster 54yrs & up 07/28/2021   Gala Murdoch  Sars-Covid-2 Vaccination 11/20/2019, 12/19/2019, 01/16/2020, 08/01/2020     Objective: Vital Signs: BP (!) 163/107 (BP Location: Left Arm, Patient Position: Sitting, Cuff Size: Normal)   Pulse 69   Resp 15   Ht 5' 10.5" (1.791 m)   Wt 199 lb (90.3 kg)   BMI 28.15 kg/m    Physical Exam Vitals and nursing note reviewed.  Constitutional:      Appearance: He is well-developed.  HENT:     Head: Normocephalic and atraumatic.  Eyes:     Conjunctiva/sclera: Conjunctivae normal.     Pupils: Pupils are equal,  round, and reactive to light.  Cardiovascular:     Rate and Rhythm: Normal rate and regular rhythm.     Heart sounds: Normal heart sounds.  Pulmonary:     Effort: Pulmonary effort is normal.     Breath sounds: Normal breath sounds.  Abdominal:     General: Bowel sounds are normal.     Palpations: Abdomen is soft.  Musculoskeletal:     Cervical back: Normal range of motion and neck supple.  Skin:    General: Skin is warm and dry.     Capillary Refill: Capillary refill takes less than 2 seconds.  Neurological:     Mental Status: He is alert and oriented to person, place, and time.  Psychiatric:        Behavior: Behavior normal.      Musculoskeletal Exam: He had limited lateral rotation of the cervical spine.  Thoracic and lumbar spine were in good range of motion with forward flexion.  Lateral rotation was limited.  Shoulders were in good range of motion.  He had bilateral elbow joint contracture which were unchanged.  He had bilateral PIP and DIP thickening with incomplete extension of several of his PIP joints.  Thickening of the flexor tendons was noted.  He had limited extension of his PIP and DIP joints.  He had limited range of motion of bilateral hip joints.  Right hip joint was replaced.  Knee joints were in good range of motion without any warmth swelling or effusion.  There was no tenderness over ankles or MTPs.  CDAI Exam: CDAI Score: -- Patient Global: --; Provider Global: -- Swollen: --; Tender: -- Joint Exam 08/16/2023   No joint exam has been documented for this visit   There is currently no information documented on the homunculus. Go to the Rheumatology activity and complete the homunculus joint exam.  Investigation: No additional findings.  Imaging: No results found.  Recent Labs: Lab Results  Component Value Date   WBC 7.4 02/14/2023   HGB 15.5 02/14/2023   PLT 268 02/14/2023   NA 138 02/14/2023   K 4.1 02/14/2023   CL 102 02/14/2023   CO2 27  02/14/2023   GLUCOSE 87 02/14/2023   BUN 17 02/14/2023   CREATININE 1.02 02/14/2023   BILITOT 0.5 02/14/2023   ALKPHOS 112 08/15/2017   AST 19 02/14/2023   ALT 10 02/14/2023   PROT 7.6 02/14/2023   ALBUMIN 4.3 08/15/2017   CALCIUM 9.2 02/14/2023   GFRAA 80 06/08/2020    Speciality Comments: No specialty comments available.  Procedures:  No procedures performed Allergies: Patient has no known allergies.   Assessment / Plan:     Visit Diagnoses: Reiter's disease of multiple sites (HCC) - In remission.  Patient denies any history of inflammatory arthritis in a while.  No synovitis was noted on the examination.  He discontinued Methotrexate in May 2019.  He took  long-term prednisone in the past.  Idiopathic chronic gout of multiple sites without tophus -he has not had a gout flare in a long time.  He has been taking allopurinol 300 mg 1 tablet by mouth daily. uric acid: 5.2 on 02/14/2023.  Will check uric acid level today.  Patient states that he consumes occasional red meat and drinks alcohol occasionally.  Dietary modifications were discussed.  Medication monitoring encounter-will check CBC and CMP today.  CBC and CMP were normal in May 2024.  Primary osteoarthritis of both hands-he has severe arthritis in his bilateral hands with PIP and DIP thickening.  He has incomplete extension of his PIP and DIP joints.  He takes ibuprofen on as needed basis.  A handout on hand exercises was given.  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  He had limited range of motion of bilateral hip joints without any discomfort.  Trigger little finger of right hand-resolved.  Orders: Orders Placed This Encounter  Procedures   CBC with Differential/Platelet   COMPLETE METABOLIC PANEL WITH GFR   Uric acid   No orders of the defined types were placed in this encounter.    Follow-Up Instructions: Return in about 6 months  (around 02/13/2024) for Gout.   Pollyann Savoy, MD  Note - This record has been created using Animal nutritionist.  Chart creation errors have been sought, but may not always  have been located. Such creation errors do not reflect on  the standard of medical care.

## 2023-08-16 ENCOUNTER — Encounter: Payer: Self-pay | Admitting: Rheumatology

## 2023-08-16 ENCOUNTER — Ambulatory Visit: Payer: Medicare Other | Attending: Rheumatology | Admitting: Rheumatology

## 2023-08-16 VITALS — BP 163/107 | HR 69 | Resp 15 | Ht 70.5 in | Wt 199.0 lb

## 2023-08-16 DIAGNOSIS — M19042 Primary osteoarthritis, left hand: Secondary | ICD-10-CM

## 2023-08-16 DIAGNOSIS — Z5181 Encounter for therapeutic drug level monitoring: Secondary | ICD-10-CM

## 2023-08-16 DIAGNOSIS — M24529 Contracture, unspecified elbow: Secondary | ICD-10-CM

## 2023-08-16 DIAGNOSIS — M0239 Reiter's disease, multiple sites: Secondary | ICD-10-CM | POA: Diagnosis not present

## 2023-08-16 DIAGNOSIS — M19041 Primary osteoarthritis, right hand: Secondary | ICD-10-CM | POA: Diagnosis not present

## 2023-08-16 DIAGNOSIS — M1A09X0 Idiopathic chronic gout, multiple sites, without tophus (tophi): Secondary | ICD-10-CM | POA: Diagnosis not present

## 2023-08-16 DIAGNOSIS — M65351 Trigger finger, right little finger: Secondary | ICD-10-CM

## 2023-08-16 DIAGNOSIS — Z96641 Presence of right artificial hip joint: Secondary | ICD-10-CM

## 2023-08-16 NOTE — Patient Instructions (Signed)

## 2023-08-17 LAB — COMPLETE METABOLIC PANEL WITH GFR
AG Ratio: 1.4 (calc) (ref 1.0–2.5)
ALT: 10 U/L (ref 9–46)
AST: 19 U/L (ref 10–35)
Albumin: 4.3 g/dL (ref 3.6–5.1)
Alkaline phosphatase (APISO): 113 U/L (ref 35–144)
BUN: 18 mg/dL (ref 7–25)
CO2: 30 mmol/L (ref 20–32)
Calcium: 9.6 mg/dL (ref 8.6–10.3)
Chloride: 103 mmol/L (ref 98–110)
Creat: 0.94 mg/dL (ref 0.70–1.28)
Globulin: 3 g/dL (ref 1.9–3.7)
Glucose, Bld: 84 mg/dL (ref 65–99)
Potassium: 4.7 mmol/L (ref 3.5–5.3)
Sodium: 140 mmol/L (ref 135–146)
Total Bilirubin: 0.3 mg/dL (ref 0.2–1.2)
Total Protein: 7.3 g/dL (ref 6.1–8.1)
eGFR: 86 mL/min/{1.73_m2} (ref >=60)

## 2023-08-17 LAB — CBC WITH DIFFERENTIAL/PLATELET
Absolute Lymphocytes: 1782 {cells}/uL (ref 850–3900)
Absolute Monocytes: 348 {cells}/uL (ref 200–950)
Basophils Absolute: 47 {cells}/uL (ref 0–200)
Basophils Relative: 0.8 %
Eosinophils Absolute: 148 {cells}/uL (ref 15–500)
Eosinophils Relative: 2.5 %
HCT: 44.6 % (ref 38.5–50.0)
Hemoglobin: 15 g/dL (ref 13.2–17.1)
MCH: 32.1 pg (ref 27.0–33.0)
MCHC: 33.6 g/dL (ref 32.0–36.0)
MCV: 95.5 fL (ref 80.0–100.0)
MPV: 10.4 fL (ref 7.5–12.5)
Monocytes Relative: 5.9 %
Neutro Abs: 3575 {cells}/uL (ref 1500–7800)
Neutrophils Relative %: 60.6 %
Platelets: 259 10*3/uL (ref 140–400)
RBC: 4.67 10*6/uL (ref 4.20–5.80)
RDW: 12.9 % (ref 11.0–15.0)
Total Lymphocyte: 30.2 %
WBC: 5.9 10*3/uL (ref 3.8–10.8)

## 2023-08-17 LAB — URIC ACID: Uric Acid, Serum: 4.7 mg/dL (ref 4.0–8.0)

## 2023-08-18 NOTE — Progress Notes (Signed)
CBC and CMP are normal.  Uric acid is in the desirable range.

## 2023-09-26 ENCOUNTER — Other Ambulatory Visit: Payer: Self-pay | Admitting: Physician Assistant

## 2023-09-26 NOTE — Telephone Encounter (Signed)
Last Fill: 06/17/2023  Labs: 08/16/2023 CBC and CMP are normal.  Uric acid is in the desirable range.   Next Visit: 02/13/2024  Last Visit: 08/16/2023  DX:  Idiopathic chronic gout of multiple sites without tophus   Current Dose per office note 08/16/2023: allopurinol 300 mg 1 tablet by mouth daily.   Okay to refill Allopurinol?

## 2024-01-03 ENCOUNTER — Other Ambulatory Visit: Payer: Self-pay | Admitting: Physician Assistant

## 2024-01-03 DIAGNOSIS — M1A09X0 Idiopathic chronic gout, multiple sites, without tophus (tophi): Secondary | ICD-10-CM

## 2024-01-03 DIAGNOSIS — Z5181 Encounter for therapeutic drug level monitoring: Secondary | ICD-10-CM

## 2024-01-03 NOTE — Addendum Note (Signed)
 Addended by: Henriette Combs on: 01/03/2024 11:26 AM   Modules accepted: Orders

## 2024-01-03 NOTE — Telephone Encounter (Signed)
 Last Fill: 09/26/2023  Labs: 08/16/2023 CBC and CMP are normal.  Uric acid is in the desirable range.   Next Visit: 02/05/2024  Last Visit: 08/16/2023  DX: Idiopathic chronic gout of multiple sites without tophus   Current Dose per office note 08/16/2023: allopurinol 300 mg 1 tablet by mouth daily   Okay to refill Allopurinol?

## 2024-01-22 NOTE — Progress Notes (Unsigned)
 Office Visit Note  Patient: Bruce Tran             Date of Birth: 15-Aug-1951           MRN: [id number]             PCP: Rice Chamorro, MD Referring: Rice Chamorro, MD Visit Date: 02/05/2024 Occupation: @GUAROCC @  Subjective:  Medication monitoring   History of Present Illness: Lajarvis Farleigh is a 73 y.o. male with history of reiter's disease, gout, and osteoarthritis.  He denies any signs or symptoms of a flare of inflammatory arthritis or gout flare.  Patient remains on allopurinol  300 mg 1 tablet by mouth daily for management of gout.  He continues to tolerate allopurinol  without any side effects and has not had any gaps in therapy.  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  He takes ibuprofen 400-600 mg every morning for pain relief.  He denies any joint swelling at this time.  He denies any nocturnal pain.  He denies any new medical conditions.  He denies any recent or recurrent infections.  He went for his yearly physical in January 2025.  Activities of Daily Living:  Patient reports morning stiffness for 30 minutes.   Patient Denies nocturnal pain.  Difficulty dressing/grooming: Denies Difficulty climbing stairs: Denies Difficulty getting out of chair: Denies Difficulty using hands for taps, buttons, cutlery, and/or writing: Denies  Review of Systems  Constitutional:  Negative for fatigue.  HENT:  Negative for mouth sores and mouth dryness.   Eyes:  Negative for dryness.  Respiratory:  Negative for shortness of breath.   Cardiovascular:  Negative for chest pain and palpitations.  Gastrointestinal:  Negative for blood in stool, constipation and diarrhea.  Endocrine: Negative for increased urination.  Genitourinary:  Negative for involuntary urination.  Musculoskeletal:  Positive for morning stiffness. Negative for joint pain, gait problem, joint pain, joint swelling, myalgias, muscle weakness, muscle tenderness and myalgias.   Skin:  Negative for color change, rash, hair loss and sensitivity to sunlight.  Allergic/Immunologic: Negative for susceptible to infections.  Neurological:  Negative for dizziness and headaches.  Hematological:  Negative for swollen glands.  Psychiatric/Behavioral:  Negative for depressed mood and sleep disturbance. The patient is not nervous/anxious.     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HCC) 08/17/2016   Osteoarthritis of right hip 08/17/2016    Past Medical History:  Diagnosis Date   Borderline high cholesterol    Gout 08/17/2016   Crystal Proven   Osteoarthritis of right hip 08/17/2016   Reiter's disease (HCC) 08/17/2016    Family History  Problem Relation Age of Onset   Emphysema Mother    Emphysema Sister    Breast cancer Sister    Breast cancer Sister    Psoriasis Other    Arthritis Other        Psoriatic arthritis   Lymphoma Other        Secondary to immunosuppression   Past Surgical History:  Procedure Laterality Date   CATARACT EXTRACTION Left 04/12/2020   CYST REMOVAL NECK  07/30/2022   JOINT REPLACEMENT Left 04/2018   total hip    TONSILLECTOMY     Social History   Social History Narrative  Not on file   Immunization History  Administered Date(s) Administered   Influenza-Unspecified 07/14/2021   Moderna Covid-19 Fall Seasonal Vaccine 13yrs & older 07/24/2022   Moderna Covid-19 Vaccine Bivalent Booster 36yrs & up 07/28/2021   Moderna Sars-Covid-2 Vaccination 11/20/2019, 12/19/2019, 01/16/2020, 08/01/2020     Objective: Vital Signs: BP 122/74 (BP Location: Left Arm, Patient Position: Sitting, Cuff Size: Large)   Pulse 69   Resp 14   Ht 5' 10.5" (1.791 m)   Wt 196 lb (88.9 kg)   BMI 27.73 kg/m     Physical Exam Vitals and nursing note reviewed.  Constitutional:      Appearance: He is well-developed.  HENT:     Head: Normocephalic and atraumatic.  Eyes:     Conjunctiva/sclera: Conjunctivae normal.     Pupils: Pupils are equal, round, and reactive to light.  Cardiovascular:     Rate and Rhythm: Normal rate and regular rhythm.     Heart sounds: Normal heart sounds.  Pulmonary:     Effort: Pulmonary effort is normal.     Breath sounds: Normal breath sounds.  Abdominal:     General: Bowel sounds are normal.     Palpations: Abdomen is soft.  Musculoskeletal:     Cervical back: Normal range of motion and neck supple.  Skin:    General: Skin is warm and dry.     Capillary Refill: Capillary refill takes less than 2 seconds.  Neurological:     Mental Status: He is alert and oriented to person, place, and time.  Psychiatric:        Behavior: Behavior normal.      Musculoskeletal Exam: C-spine has limited range of motion with lateral rotation.  Thoracic and lumbar spine have good range of motion.  Shoulder joints have good range of motion with no discomfort.  Bilateral elbow joint flexion contractures appear unchanged.  PIP and DIP thickening consistent with osteoarthritis of both hands.  Limited extension of the PIP joints.  Thickening of the flexor tendons noted.  Right hip replacement and left hip have slightly limited range of motion but no groin pain.  Knee joints have good range of motion no warmth or effusion.  Ankle joints have good range of motion with no tenderness or joint swelling.  CDAI Exam: CDAI Score: -- Patient Global: --; Provider Global: -- Swollen: --; Tender: -- Joint Exam 02/05/2024   No joint exam has been documented for this visit   There is currently no information documented on the homunculus. Go to the Rheumatology activity and complete the homunculus joint exam.  Investigation: No additional findings.  Imaging: No results found.  Recent  Labs: Lab Results  Component Value Date   WBC 5.9 08/16/2023   HGB 15.0 08/16/2023   PLT 259 08/16/2023   NA 140 08/16/2023   K 4.7 08/16/2023   CL 103 08/16/2023   CO2 30 08/16/2023   GLUCOSE 84 08/16/2023   BUN 18 08/16/2023   CREATININE 0.94 08/16/2023   BILITOT 0.3 08/16/2023   ALKPHOS 112 08/15/2017   AST 19 08/16/2023   ALT 10 08/16/2023   PROT 7.3 08/16/2023   ALBUMIN 4.3 08/15/2017   CALCIUM 9.6 08/16/2023   GFRAA 80 06/08/2020    Speciality Comments: No specialty comments available.  Procedures:  No procedures performed Allergies: Patient has no known allergies.   Assessment / Plan:     Visit Diagnoses: Reiter's disease of multiple sites (HCC) - In remission.  Discontinued methotrexate  in 2019.  He  has not had any signs or symptoms of a flare of inflammatory athritis.  He had no synovitis or joint effusion on examination today.  He has been taking ibuprofen 400 to 600 mg daily for symptomatic relief. He will notify us  if he develops any new or worsening symptoms.  He will follow-up in the office in 6 months or sooner if needed.  Idiopathic chronic gout of multiple sites without tophus - He has not had any signs or symptoms of a gout flare.  He has clinically been doing well taking allopurinol  300 mg 1 tablet by mouth daily.  Uric acid level was 4.7 on 08/16/2023.  Plan to update uric acid, CBC, and CMP today.  He will notify us  if he develops signs or symptoms of a gout flare.- Plan: Uric acid  Medication monitoring encounter - Uric acid was 4.7 on 08/16/23. CBC and CMP updated on 08/16/23. Orders for CBC and CMP released today. Plan: CBC with Differential/Platelet, Comprehensive metabolic panel with GFR, Uric acid  Primary osteoarthritis of both hands: Patient has severe osteoarthritic changes involving both hands.  Limited extension of the PIP and DIP joints noted.  No active synovitis or dactylitis noted.  He takes ibuprofen 400 to 600 mg on a daily basis which helps to  alleviate his symptoms.  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  Contracture of elbow, bilateral: Unchanged.  No tenderness or inflammation along the joint line.   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 Doing well.  Slightly limited ROM but no groin pain.  Trigger little finger of right hand: Resolved.   Orders: Orders Placed This Encounter  Procedures   CBC with Differential/Platelet   Comprehensive metabolic panel with GFR   Uric acid   No orders of the defined types were placed in this encounter.     Follow-Up Instructions: Return in about 6 months (around 08/06/2024) for Reiter's disease , Gout.   Romayne Clubs, PA-C  Note - This record has been created using Dragon software.  Chart creation errors have been sought, but may not always  have been located. Such creation errors do not reflect on  the standard of medical care.

## 2024-02-05 ENCOUNTER — Ambulatory Visit: Payer: PRIVATE HEALTH INSURANCE | Attending: Physician Assistant | Admitting: Physician Assistant

## 2024-02-05 ENCOUNTER — Encounter: Payer: Self-pay | Admitting: Physician Assistant

## 2024-02-05 VITALS — BP 122/74 | HR 69 | Resp 14 | Ht 70.5 in | Wt 196.0 lb

## 2024-02-05 DIAGNOSIS — M1A09X0 Idiopathic chronic gout, multiple sites, without tophus (tophi): Secondary | ICD-10-CM | POA: Diagnosis not present

## 2024-02-05 DIAGNOSIS — Z5181 Encounter for therapeutic drug level monitoring: Secondary | ICD-10-CM

## 2024-02-05 DIAGNOSIS — M19042 Primary osteoarthritis, left hand: Secondary | ICD-10-CM

## 2024-02-05 DIAGNOSIS — Z96641 Presence of right artificial hip joint: Secondary | ICD-10-CM

## 2024-02-05 DIAGNOSIS — M65351 Trigger finger, right little finger: Secondary | ICD-10-CM

## 2024-02-05 DIAGNOSIS — M19041 Primary osteoarthritis, right hand: Secondary | ICD-10-CM

## 2024-02-05 DIAGNOSIS — M0239 Reiter's disease, multiple sites: Secondary | ICD-10-CM | POA: Diagnosis not present

## 2024-02-05 DIAGNOSIS — M24529 Contracture, unspecified elbow: Secondary | ICD-10-CM

## 2024-02-05 LAB — COMPREHENSIVE METABOLIC PANEL WITH GFR
AG Ratio: 1.4 (calc) (ref 1.0–2.5)
ALT: 29 U/L (ref 9–46)
AST: 29 U/L (ref 10–35)
Albumin: 4.2 g/dL (ref 3.6–5.1)
Alkaline phosphatase (APISO): 95 U/L (ref 35–144)
BUN: 17 mg/dL (ref 7–25)
CO2: 28 mmol/L (ref 20–32)
Calcium: 9.4 mg/dL (ref 8.6–10.3)
Chloride: 104 mmol/L (ref 98–110)
Creat: 0.96 mg/dL (ref 0.70–1.28)
Globulin: 3 g/dL (ref 1.9–3.7)
Glucose, Bld: 92 mg/dL (ref 65–99)
Potassium: 5.2 mmol/L (ref 3.5–5.3)
Sodium: 138 mmol/L (ref 135–146)
Total Bilirubin: 0.5 mg/dL (ref 0.2–1.2)
Total Protein: 7.2 g/dL (ref 6.1–8.1)
eGFR: 83 mL/min/{1.73_m2} (ref >=60)

## 2024-02-05 LAB — CBC WITH DIFFERENTIAL/PLATELET
Absolute Lymphocytes: 1462 {cells}/uL (ref 850–3900)
Absolute Monocytes: 347 {cells}/uL (ref 200–950)
Basophils Absolute: 50 {cells}/uL (ref 0–200)
Basophils Relative: 0.9 %
Eosinophils Absolute: 179 {cells}/uL (ref 15–500)
Eosinophils Relative: 3.2 %
HCT: 42.3 % (ref 38.5–50.0)
Hemoglobin: 14.4 g/dL (ref 13.2–17.1)
MCH: 31.8 pg (ref 27.0–33.0)
MCHC: 34 g/dL (ref 32.0–36.0)
MCV: 93.4 fL (ref 80.0–100.0)
MPV: 10.3 fL (ref 7.5–12.5)
Monocytes Relative: 6.2 %
Neutro Abs: 3562 {cells}/uL (ref 1500–7800)
Neutrophils Relative %: 63.6 %
Platelets: 261 10*3/uL (ref 140–400)
RBC: 4.53 10*6/uL (ref 4.20–5.80)
RDW: 12.8 % (ref 11.0–15.0)
Total Lymphocyte: 26.1 %
WBC: 5.6 10*3/uL (ref 3.8–10.8)

## 2024-02-05 LAB — URIC ACID: Uric Acid, Serum: 4.5 mg/dL (ref 4.0–8.0)

## 2024-02-06 NOTE — Progress Notes (Signed)
CBC, CMP and uric acid are in the desirable range.

## 2024-02-13 ENCOUNTER — Ambulatory Visit: Payer: PRIVATE HEALTH INSURANCE | Admitting: Physician Assistant

## 2024-04-24 ENCOUNTER — Other Ambulatory Visit: Payer: Self-pay | Admitting: Physician Assistant

## 2024-04-24 NOTE — Telephone Encounter (Signed)
 Last Fill: 01/03/2024  Labs: 02/05/2024 CBC, CMP and uric acid are in the desirable range.   Next Visit: 08/06/2024  Last Visit: 02/05/2024  DX: Idiopathic chronic gout of multiple sites without tophus   Current Dose per office note 02/05/2024: allopurinol  300 mg 1 tablet by mouth daily   Okay to refill Allopurinol ?

## 2024-05-06 DIAGNOSIS — B029 Zoster without complications: Secondary | ICD-10-CM

## 2024-05-06 HISTORY — DX: Zoster without complications: B02.9

## 2024-07-24 NOTE — Progress Notes (Signed)
 Office Visit Note  Patient: Bruce Tran             Date of Birth: [birth year]           MRN: [id number]             PCP: Irven Ozell DEL, MD Referring: Irven Ozell DEL, MD Visit Date: 08/06/2024 Occupation: Data Unavailable  Subjective:  Medication monitoring  History of Present Illness: Yoshiharu Brassell is a 73 y.o. male with history of Reiter's and gout.  He returns today after his last visit in April 2025.  He has not had any flares of inflammatory arthritis or gout.  He has been taking allopurinol  300 mg daily.  [place] about 6 days ago.  He has not developed any rash or fever.  He has not had any flares of inflammatory arthritis.    Activities of Daily Living:  Patient reports morning stiffness for 30 minutes.   Patient Denies nocturnal pain.  Difficulty dressing/grooming: Denies Difficulty climbing stairs: Denies Difficulty getting out of chair: Denies Difficulty using hands for taps, buttons, cutlery, and/or writing: Denies  Review of Systems  Constitutional:  Negative for fatigue.  HENT:  Negative for mouth sores and mouth dryness.   Eyes:  Negative for dryness.  Respiratory:  Negative for shortness of breath.   Cardiovascular:  Negative for chest pain and palpitations.  Gastrointestinal:  Negative for blood in stool, constipation and diarrhea.  Endocrine: Negative for increased urination.  Genitourinary:  Negative for involuntary urination.  Musculoskeletal:  Positive for morning stiffness. Negative for joint pain, gait problem, joint pain, joint swelling, myalgias, muscle weakness, muscle tenderness and myalgias.  Skin:  Negative for color change, rash, hair loss and sensitivity to sunlight.  Allergic/Immunologic: Negative for susceptible to infections.  Neurological:  Negative for dizziness and headaches.  Hematological:  Negative for swollen glands.  Psychiatric/Behavioral:  Negative for depressed mood and  sleep disturbance. The patient is not nervous/anxious.     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HCC) 08/17/2016   Osteoarthritis of right hip 08/17/2016    Past Medical History:  Diagnosis Date   Borderline high cholesterol    Gout 08/17/2016   Crystal Proven   Osteoarthritis of right hip 08/17/2016   Reiter's disease (HCC) 08/17/2016   Shingles 05/06/2024    Family History  Problem Relation Age of Onset   Emphysema Mother    Emphysema Sister    Breast cancer Sister    Breast cancer Sister    Psoriasis Other    Arthritis Other        Psoriatic arthritis   Lymphoma Other        Secondary to immunosuppression   Past Surgical History:  Procedure Laterality Date   CATARACT EXTRACTION Left 04/12/2020   CYST REMOVAL NECK  07/30/2022   JOINT REPLACEMENT Left 04/2018   total hip    TONSILLECTOMY     Social History   Tobacco Use   Smoking status: Former    Current packs/day: 0.00    Average packs/day: 1 pack/day for 12.0 years (12.0 ttl pk-yrs)    Types: Cigarettes    Start date: 80    Quit date: 1988  Years since quitting: 37.8    Passive exposure: Never   Smokeless tobacco: Current    Types: Snuff, Chew  Vaping Use   Vaping status: Never Used  Substance Use Topics   Alcohol use: Yes    Alcohol/week: 1.0 standard drink of alcohol    Types: 1 Cans of beer per week    Comment: occasional   Drug use: No   Social History   Social History Narrative   Not on file     Immunization History  Administered Date(s) Administered   Influenza-Unspecified 07/14/2021   Moderna Covid-19 Fall Seasonal Vaccine 59yrs & older 07/24/2022   Moderna Covid-19 Vaccine Bivalent Booster 11yrs & up 07/28/2021   Moderna Sars-Covid-2  Vaccination 11/20/2019, 12/19/2019, 01/16/2020, 08/01/2020     Objective: Vital Signs: BP 135/81   Pulse 75   Temp 98.4 F (36.9 C)   Resp 15   Ht 5' 10.5 (1.791 m)   Wt 191 lb 9.6 oz (86.9 kg)   BMI 27.10 kg/m    Physical Exam Vitals and nursing note reviewed.  Constitutional:      Appearance: He is well-developed.  HENT:     Head: Normocephalic and atraumatic.  Eyes:     Conjunctiva/sclera: Conjunctivae normal.     Pupils: Pupils are equal, round, and reactive to light.  Cardiovascular:     Rate and Rhythm: Normal rate and regular rhythm.     Heart sounds: Normal heart sounds.  Pulmonary:     Effort: Pulmonary effort is normal.     Breath sounds: Normal breath sounds.  Abdominal:     General: Bowel sounds are normal.     Palpations: Abdomen is soft.  Musculoskeletal:     Cervical back: Normal range of motion and neck supple.  Skin:    General: Skin is warm and dry.     Capillary Refill: Capillary refill takes less than 2 seconds.  Neurological:     Mental Status: He is alert and oriented to person, place, and time.  Psychiatric:        Behavior: Behavior normal.      Musculoskeletal Exam: He had limited range of motion of the cervical spine.  Thoracic and lumbar spine were in good range of motion.  He was able to reach the floor with his fingertips.  There was no SI joint tenderness.  Shoulder joints were in good range of motion, bilateral elbow joints had flexion contracture without synovitis.  Wrist joints and MCPs were in good range of motion.  He had a scar on his left hand dorsum from the injury.  He had contracture of bilateral PIP and DIP joints with limited extension of his fingers.  Hip joints and knee joints were in good range of motion without any warmth swelling or effusion.  Right hip joint is replaced.  There was no tenderness over ankles or MTPs.  No synovitis was noted on the examination.  CDAI Exam: CDAI Score: -- Patient Global: --; Provider  Global: -- Swollen: --; Tender: -- Joint Exam 08/06/2024   No joint exam has been documented for this visit   There is currently no information documented on the homunculus. Go to the Rheumatology activity and complete the homunculus joint exam.  Investigation: No additional findings.  Imaging: No results found.  Recent Labs: Lab Results  Component Value Date   WBC 5.6 02/05/2024   HGB 14.4 02/05/2024   PLT 261 02/05/2024   NA 138 02/05/2024   K 5.2 02/05/2024  CL 104 02/05/2024   CO2 28 02/05/2024   GLUCOSE 92 02/05/2024   BUN 17 02/05/2024   CREATININE 0.96 02/05/2024   BILITOT 0.5 02/05/2024   ALKPHOS 112 08/15/2017   AST 29 02/05/2024   ALT 29 02/05/2024   PROT 7.2 02/05/2024   ALBUMIN 4.3 08/15/2017   CALCIUM 9.4 02/05/2024   GFRAA 80 06/08/2020    Speciality Comments: No specialty comments available.  Procedures:  No procedures performed Allergies: Patient has no known allergies.   Assessment / Plan:     Visit Diagnoses: Reiter's disease of multiple sites (HCC) - In remission.  He has been off methotrexate  since 2019.  He takes ibuprofen as needed.  No synovitis was noted on the examination.  No synovial thickening was noted.  Idiopathic chronic gout of multiple sites without tophus -he is on allopurinol  300 mg p.o. daily.  February 05, 2024 uric acid 4.5.  He has not had any gout flares.- Plan: Uric acid  Medication monitoring encounter - February 05, 2024 CBC and CMP were normal. - Plan: CBC with Differential/Platelet, Comprehensive metabolic panel with GFR  Trigger little finger of right hand-currently not symptomatic.  Primary osteoarthritis of both hands-he has severe osteoarthritis in bilateral hands.  He had bilateral PIP and DIP contractures with the limited extension of his fingers.  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  Status post total hip replacement, right-doing well without discomfort.  Orders: Orders Placed This  Encounter  Procedures   CBC with Differential/Platelet   Comprehensive metabolic panel with GFR   Uric acid   No orders of the defined types were placed in this encounter.   Follow-Up Instructions: Return in about 5 months (around 01/04/2025) for reiter's, Gout.   Maya Nash, MD  Note - This record has been created using Animal nutritionist.  Chart creation errors have been sought, but may not always  have been located. Such creation errors do not reflect on  the standard of medical care.

## 2024-08-06 ENCOUNTER — Ambulatory Visit: Payer: PRIVATE HEALTH INSURANCE | Attending: Rheumatology | Admitting: Rheumatology

## 2024-08-06 ENCOUNTER — Encounter: Payer: Self-pay | Admitting: Rheumatology

## 2024-08-06 VITALS — BP 135/81 | HR 75 | Temp 98.4°F | Resp 15 | Ht 70.5 in | Wt 191.6 lb

## 2024-08-06 DIAGNOSIS — M19041 Primary osteoarthritis, right hand: Secondary | ICD-10-CM

## 2024-08-06 DIAGNOSIS — M0239 Reiter's disease, multiple sites: Secondary | ICD-10-CM

## 2024-08-06 DIAGNOSIS — M65351 Trigger finger, right little finger: Secondary | ICD-10-CM

## 2024-08-06 DIAGNOSIS — M1A09X0 Idiopathic chronic gout, multiple sites, without tophus (tophi): Secondary | ICD-10-CM | POA: Diagnosis not present

## 2024-08-06 DIAGNOSIS — Z5181 Encounter for therapeutic drug level monitoring: Secondary | ICD-10-CM

## 2024-08-06 DIAGNOSIS — M24529 Contracture, unspecified elbow: Secondary | ICD-10-CM

## 2024-08-06 DIAGNOSIS — M19042 Primary osteoarthritis, left hand: Secondary | ICD-10-CM

## 2024-08-06 DIAGNOSIS — Z96641 Presence of right artificial hip joint: Secondary | ICD-10-CM

## 2024-08-06 LAB — COMPREHENSIVE METABOLIC PANEL WITH GFR
AG Ratio: 1.5 (calc) (ref 1.0–2.5)
ALT: 9 U/L (ref 9–46)
AST: 21 U/L (ref 10–35)
Albumin: 4.3 g/dL (ref 3.6–5.1)
Alkaline phosphatase (APISO): 97 U/L (ref 35–144)
BUN: 13 mg/dL (ref 7–25)
CO2: 29 mmol/L (ref 20–32)
Calcium: 9.5 mg/dL (ref 8.6–10.3)
Chloride: 103 mmol/L (ref 98–110)
Creat: 0.96 mg/dL (ref 0.70–1.28)
Globulin: 2.8 g/dL (ref 1.9–3.7)
Glucose, Bld: 103 mg/dL — ABNORMAL HIGH (ref 65–99)
Potassium: 4.6 mmol/L (ref 3.5–5.3)
Sodium: 139 mmol/L (ref 135–146)
Total Bilirubin: 0.5 mg/dL (ref 0.2–1.2)
Total Protein: 7.1 g/dL (ref 6.1–8.1)
eGFR: 83 mL/min/1.73m2 (ref >=60)

## 2024-08-06 LAB — CBC WITH DIFFERENTIAL/PLATELET
Absolute Lymphocytes: 1683 {cells}/uL (ref 850–3900)
Absolute Monocytes: 350 {cells}/uL (ref 200–950)
Basophils Absolute: 73 {cells}/uL (ref 0–200)
Basophils Relative: 1.1 %
Eosinophils Absolute: 257 {cells}/uL (ref 15–500)
Eosinophils Relative: 3.9 %
HCT: 46.3 % (ref 38.5–50.0)
Hemoglobin: 15.4 g/dL (ref 13.2–17.1)
MCH: 31.6 pg (ref 27.0–33.0)
MCHC: 33.3 g/dL (ref 32.0–36.0)
MCV: 94.9 fL (ref 80.0–100.0)
MPV: 10.7 fL (ref 7.5–12.5)
Monocytes Relative: 5.3 %
Neutro Abs: 4237 {cells}/uL (ref 1500–7800)
Neutrophils Relative %: 64.2 %
Platelets: 264 Thousand/uL (ref 140–400)
RBC: 4.88 Million/uL (ref 4.20–5.80)
RDW: 13.2 % (ref 11.0–15.0)
Total Lymphocyte: 25.5 %
WBC: 6.6 Thousand/uL (ref 3.8–10.8)

## 2024-08-06 LAB — URIC ACID: Uric Acid, Serum: 5.2 mg/dL (ref 4.0–8.0)

## 2024-08-07 ENCOUNTER — Ambulatory Visit: Payer: Self-pay | Admitting: Rheumatology

## 2024-08-07 NOTE — Progress Notes (Signed)
 CBC and CMP are stable, glucose is mildly elevated probably not a fasting sample.  Uric acid is in the desirable range.

## 2024-09-08 ENCOUNTER — Other Ambulatory Visit: Payer: Self-pay | Admitting: Physician Assistant

## 2024-09-08 NOTE — Telephone Encounter (Signed)
 Last Fill: 04/24/2024  Labs: 08/06/2024 CBC and CMP are stable, glucose is mildly elevated probably not a fasting sample. Uric acid is in the desirable range.   Next Visit: 01/07/2025  Last Visit: 08/06/2024  DX: Idiopathic chronic gout of multiple sites without tophus   Current Dose per office note on 08/06/2024: allopurinol  300 mg p.o. daily.   Okay to refill Allopurinol ?

## 2025-01-07 ENCOUNTER — Ambulatory Visit: Payer: PRIVATE HEALTH INSURANCE | Admitting: Physician Assistant
